# Patient Record
Sex: Male | Born: 1970 | Hispanic: No | Smoking: Never smoker
Health system: Southern US, Community
[De-identification: ages and names within clinical notes are randomized; demographics above are authoritative.]

## PROBLEM LIST (undated history)

## (undated) DIAGNOSIS — I10 Essential (primary) hypertension: Secondary | ICD-10-CM

## (undated) HISTORY — DX: Essential (primary) hypertension: I10

## (undated) HISTORY — PX: SHOULDER SURGERY: SHX246

---

## 2008-12-14 ENCOUNTER — Encounter: Admission: RE | Admit: 2008-12-14 | Discharge: 2008-12-14 | Payer: Self-pay | Admitting: Family Medicine

## 2008-12-14 ENCOUNTER — Ambulatory Visit: Payer: Self-pay | Admitting: Family Medicine

## 2008-12-14 DIAGNOSIS — M25519 Pain in unspecified shoulder: Secondary | ICD-10-CM

## 2008-12-20 ENCOUNTER — Encounter: Payer: Self-pay | Admitting: Family Medicine

## 2009-04-26 ENCOUNTER — Ambulatory Visit: Payer: Self-pay | Admitting: Family Medicine

## 2009-07-04 ENCOUNTER — Ambulatory Visit: Payer: Self-pay | Admitting: Family Medicine

## 2009-07-18 ENCOUNTER — Encounter: Payer: Self-pay | Admitting: Family Medicine

## 2010-03-11 NOTE — Assessment & Plan Note (Signed)
Summary: quad strain   Vital Signs:  Patient profile:   40 year old male Height:      73 inches Weight:      250 pounds Temp:     97.7 degrees F oral Pulse rate:   82 / minute BP sitting:   135 / 91  (left arm) Cuff size:   large  Vitals Entered By: Kathlene November (April 26, 2009 8:03 AM) CC: pulled left quad muscle 1 1/2 weeks ago while running   Primary Care Provider:  Seymour Bars DO  CC:  pulled left quad muscle 1 1/2 weeks ago while running.  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  He immediately  but he did not fall.  It has continued to hurt for about 10 days now.  He is taking some ibuprofen.  He has not run since then.  He admits to being a bit 'out of shape'.  He has not tried heat/ ice/ compression.    It hurts to walk down the stairs.  Denies pain in the hip or knee.  Has bruised over the medial quad but his pain seems to be more lateral.  Denies previous L leg injury.      Current Medications (verified): 1)  None  Allergies (verified): No Known Drug Allergies  Comments:  Nurse/Medical Assistant: The patient's medications and allergies were reviewed with the patient and were updated in the Medication and Allergy Lists. Kathlene November (April 26, 2009 8:04 AM)  Past History:  Past Medical History: Reviewed history from 12/14/2008 and no changes required. HTN -- not on meds  Social History: Reviewed history from 12/14/2008 and no changes required. 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 Denies smoking or ETOH. Works out 4 x a wk.  Review of Systems      See HPI  Physical Exam  General:  alert, well-developed, well-nourished, well-hydrated, and overweight-appearing.   Neck:  no masses.   Msk:  full bilat hip ROM  normal gait with pes planus tender over the medial and lateral L quad muscle, distal 1/3 with no palpable defect or muscle  buldge Extremities:  no LE edema Skin:  color normal.   small bruise over the medial distal L quad muscle Psych:  good eye contact, not anxious appearing, and not depressed appearing.     Impression & Recommendations:  Problem # 1:  SPRAIN&STRAIN OTHER SPECIFIED SITES KNEE&LEG (ICD-844.8) L quad strain during all out sprint 10 days ago with probable partial tear of the medail or lateral quad. Will try to obtain sports u/s to look for tear.  He may need to start formal PT to rehab.  Placed in ACE wrap today.  Advised on and off use of ice packs and Advil for the next 5 days with meals (400-600 mg).   Orders: Orthopedic Referral (Ortho)  Patient Instructions: 1)  ACE wrap L quad for compression. 2)  Use ice packs on and off during the day. 3)  Use Advil 2-3 tabs with meals (3 x a day) for pain and inflammation.  4)  F/U with ortho for sports ultrasound. 5)  Schedule a physical with fasting labs in May.

## 2010-03-11 NOTE — Letter (Signed)
Summary: Patient Cancellation/[facility]  Patient Cancellation/[facility]   Imported By: Lanelle Bal 08/01/2009 14:18:56  _____________________________________________________________________  External Attachment:    Type:   Image     Comment:   External Document

## 2010-03-11 NOTE — Assessment & Plan Note (Signed)
Summary: CPE   Vital Signs:  Patient profile:   40 year old male Height:      73 inches Weight:      240 pounds BMI:     31.78 O2 Sat:      98 % on Room air Pulse rate:   77 / minute BP sitting:   150 / 96  (left arm) Cuff size:   large  Vitals Entered By: Payton Spark CMA (Jul 04, 2009 9:31 AM)  O2 Flow:  Room air CC: CPE   Primary Care Provider:  Seymour Bars DO  CC:  CPE.  History of Present Illness: 40 yo Nicholas [name] presents for CPE.  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  He denies CP or DOE.  His Tdap is UTD but he is due for fasting labs.  Denies fam hx of premature heart dz, colon or prostate cancer.  He has strong fam hx of HTN.  He has lost 10 lbs by diet and exercise.  He has had 2 wks of Bright red rectal bleeding with stools.  Denies painful defecation, hemorrhoids or constipation.    Allergies: No Known Drug Allergies  Past History:  Past Medical History: Reviewed history from 12/14/2008 and no changes required. HTN -- not on meds  Family History: Reviewed history from 12/14/2008 and no changes required. sister HTN Father died of cirrhosis Mother alive, HTN, DM  Social History: Reviewed history from 12/14/2008 and no changes required. 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 Denies smoking or ETOH. Works out 4 x a wk.  Review of Systems  The patient denies anorexia, fever, weight loss, weight gain, vision loss, decreased hearing, hoarseness, chest pain, syncope, dyspnea on exertion, peripheral edema, prolonged cough, headaches, hemoptysis, abdominal pain, melena, hematochezia, severe indigestion/heartburn, hematuria, incontinence, genital sores, muscle weakness, suspicious skin lesions, transient blindness, difficulty walking, depression, unusual weight change, abnormal bleeding, enlarged lymph nodes, angioedema, breast masses, and testicular masses.    Physical Exam  General:  alert,  well-developed, well-nourished, well-hydrated, and overweight-appearing.   Head:  normocephalic and atraumatic.   Eyes:  pupils equal, pupils round, and pupils reactive to light.   Ears:  EACs patent; TMs translucent and gray with good cone of light and bony landmarks.  Nose:  no nasal discharge.   Mouth:  good dentition and pharynx pink and moist.   Neck:  no masses.   Lungs:  Normal respiratory effort, chest expands symmetrically. Lungs are clear to auscultation, no crackles or wheezes. Heart:  Normal rate and regular rhythm. S1 and S2 normal without gallop, murmur, click, rub or other extra sounds. Abdomen:  no AA bruits. soft, non-tender, normal bowel sounds, no distention, no masses, no guarding, no hepatomegaly, and no splenomegaly.   Rectal:  No external abnormalities noted. Normal sphincter tone. No rectal masses or tenderness. Msk:  tender talar tilt L ankle (sprained 4 wks ago) Pulses:  2+ radial and pedal pulses Extremities:  no E/C/C Neurologic:  gait normal.   Skin:  color normal.   Cervical Nodes:  No lymphadenopathy noted Psych:  good eye contact, not anxious appearing, and not depressed appearing.     Impression & Recommendations:  Problem # 1:  Preventive Health Care (ICD-V70.0) Keeping healthy checklist for men reviewed. BP high today, see #2. BMI 31 c/w class I obesity.  Will work on diet, exercise, wt loss. Update fasting labs. Tetanus UTD per hx.  DRE + PSA at 45.  Problem # 2:  ESSENTIAL HYPERTENSION, BENIGN (ICD-401.1) 2nd reading >140/90.  Will start on HCTZ daily.  Update fasting labs.  Work on [employer], exercise, wt loss. RTC in 2 mos for f/u.  Will need a BMP. His updated medication list for this problem includes:    Hydrochlorothiazide 25 Mg Tabs (Hydrochlorothiazide) .Marland Kitchen... 1 tab by mouth qam  BP today: 150/96 Prior BP: 135/91 (04/26/2009)  Problem # 3:  RECTAL BLEEDING (ICD-569.3) Normal DRE-- more concerning given abscence of hemorrhoids or  fissures as to the cause for 2 wks of bright red rectal bleeding.   Will refer to GI for eval.  Check CBC with fasting labs. Orders: Gastroenterology Referral (GI) T-CBC No Diff ([id number])  Complete Medication List: 1)  Hydrochlorothiazide 25 Mg Tabs (Hydrochlorothiazide) .Marland Kitchen.. 1 tab by mouth qam  Other Orders: T-Comprehensive Metabolic Panel ([id number]) T-Lipid Profile ([id number])  Patient Instructions: 1)  Start HCTZ daily for High BP. 2)  Work on low sodium diet, regular exercise. 3)  Referral made to GI for rectal bleeding. 4)  Update fasting labs. 5)  Will call you w/ results. 6)  Return for f/u High BP in 2 mos. Prescriptions: HYDROCHLOROTHIAZIDE 25 MG TABS (HYDROCHLOROTHIAZIDE) 1 tab by mouth qAM  #30 x 2   Entered and Authorized by:   Seymour Bars DO   Signed by:   Seymour Bars DO on 07/04/2009   Method used:   Electronically to        [facility] [phone]* (retail)       [address] [place], [place]  [address]       Ph: [phone] or [phone]       Fax: [phone]   RxID:   [id number]

## 2010-08-27 IMAGING — CR DG SHOULDER 2+V*L*
4 series · 4 of 4 positions shown · non-contrast
Comparison: None.

CLINICAL DATA: 38-year-old male with left shoulder pain for 2
months.  No known injury.

LEFT SHOULDER - 2+ VIEW

[view not recorded (1 of 4)]
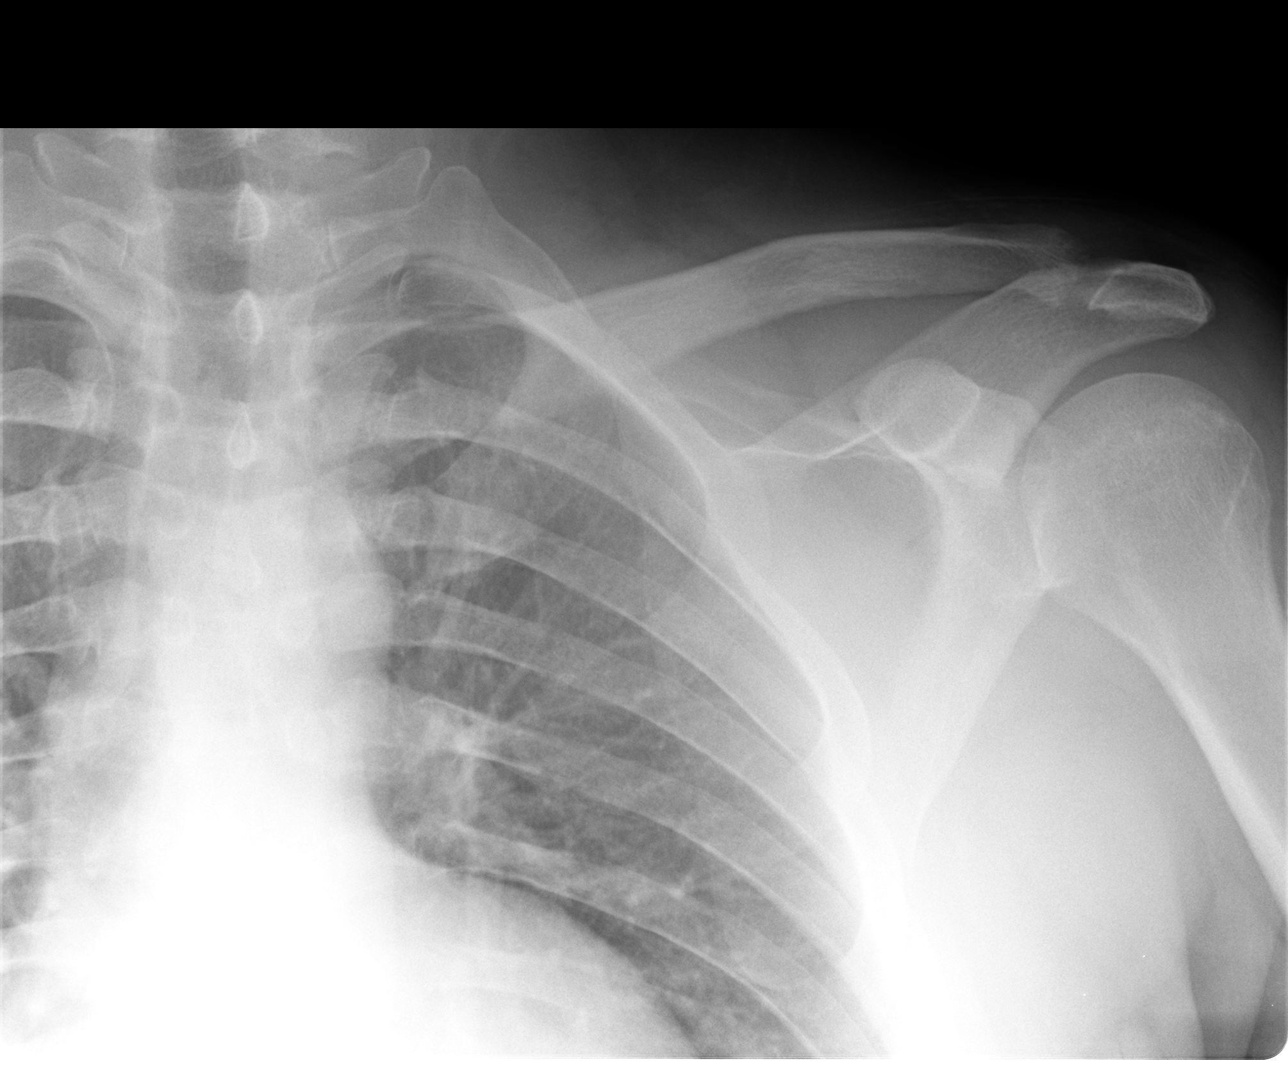

[view not recorded (2 of 4)]
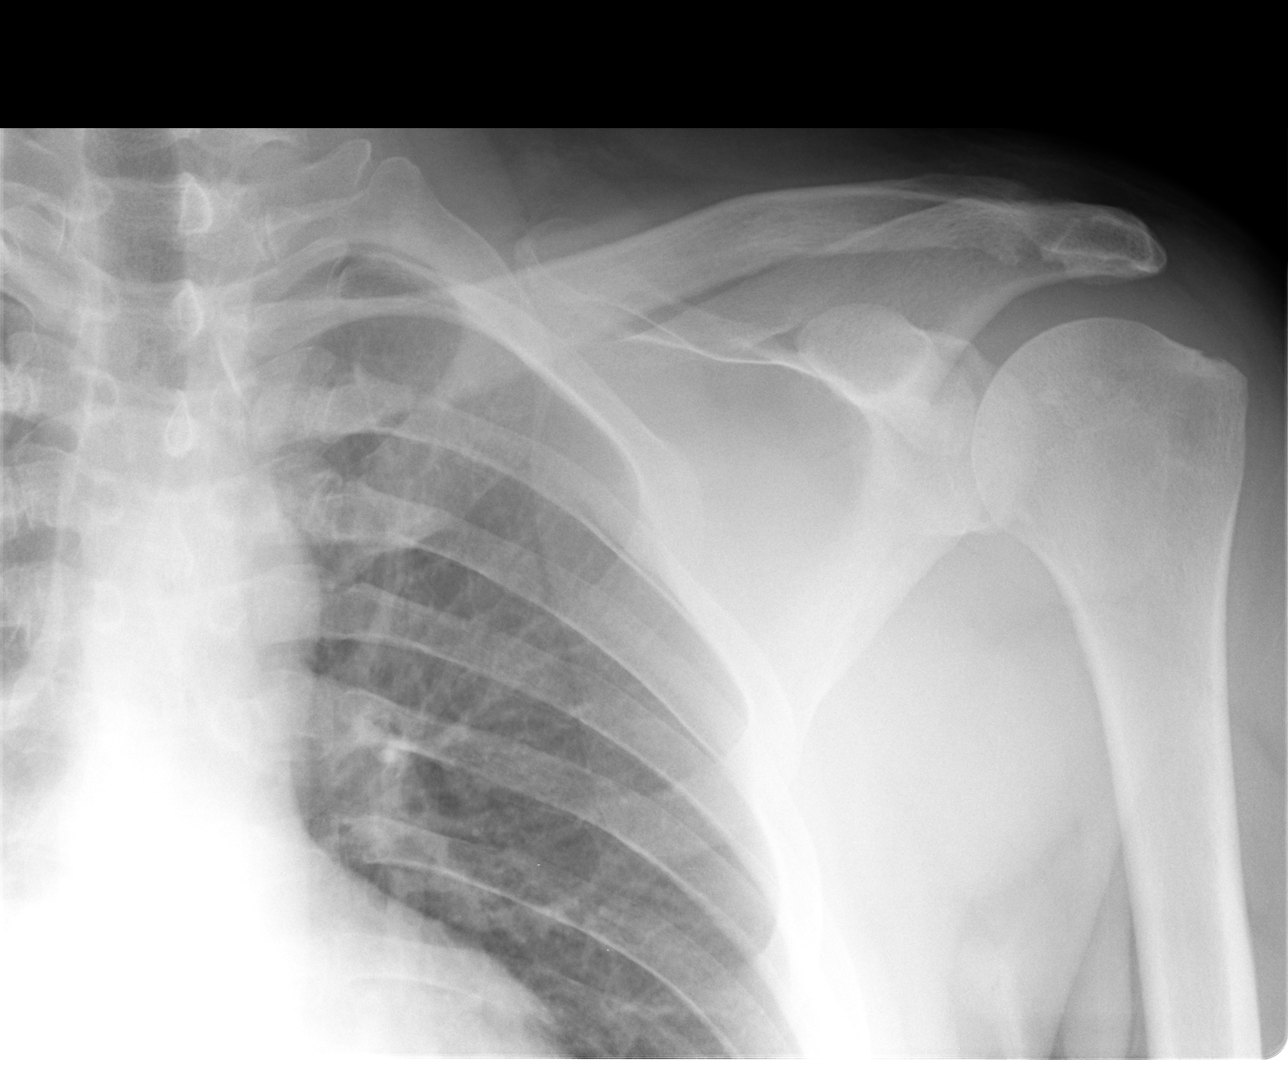

[view not recorded (3 of 4)]
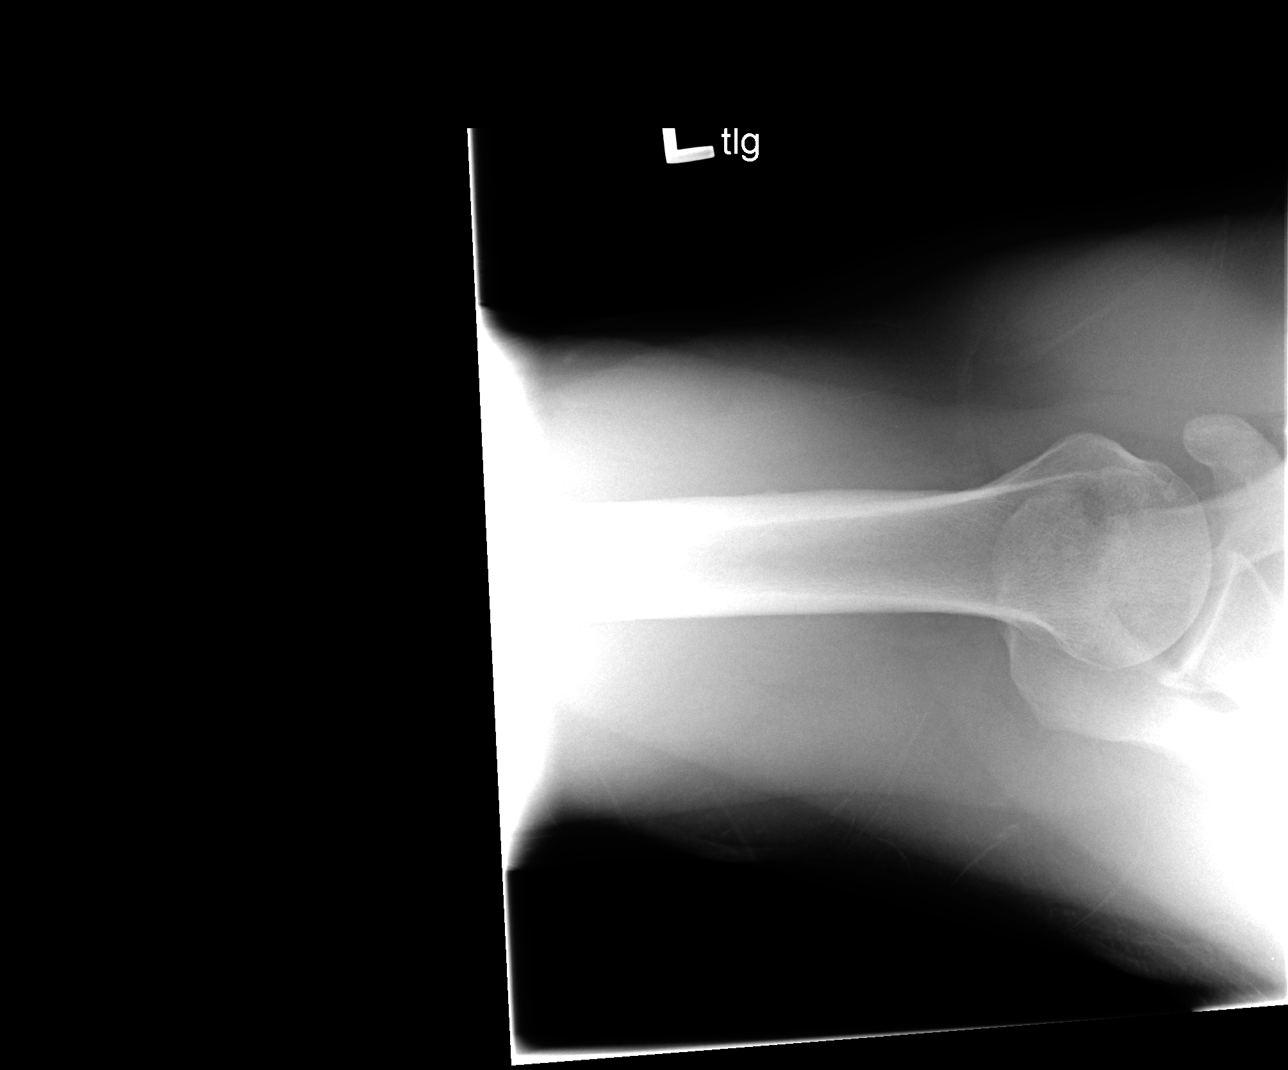

[view not recorded (4 of 4)]
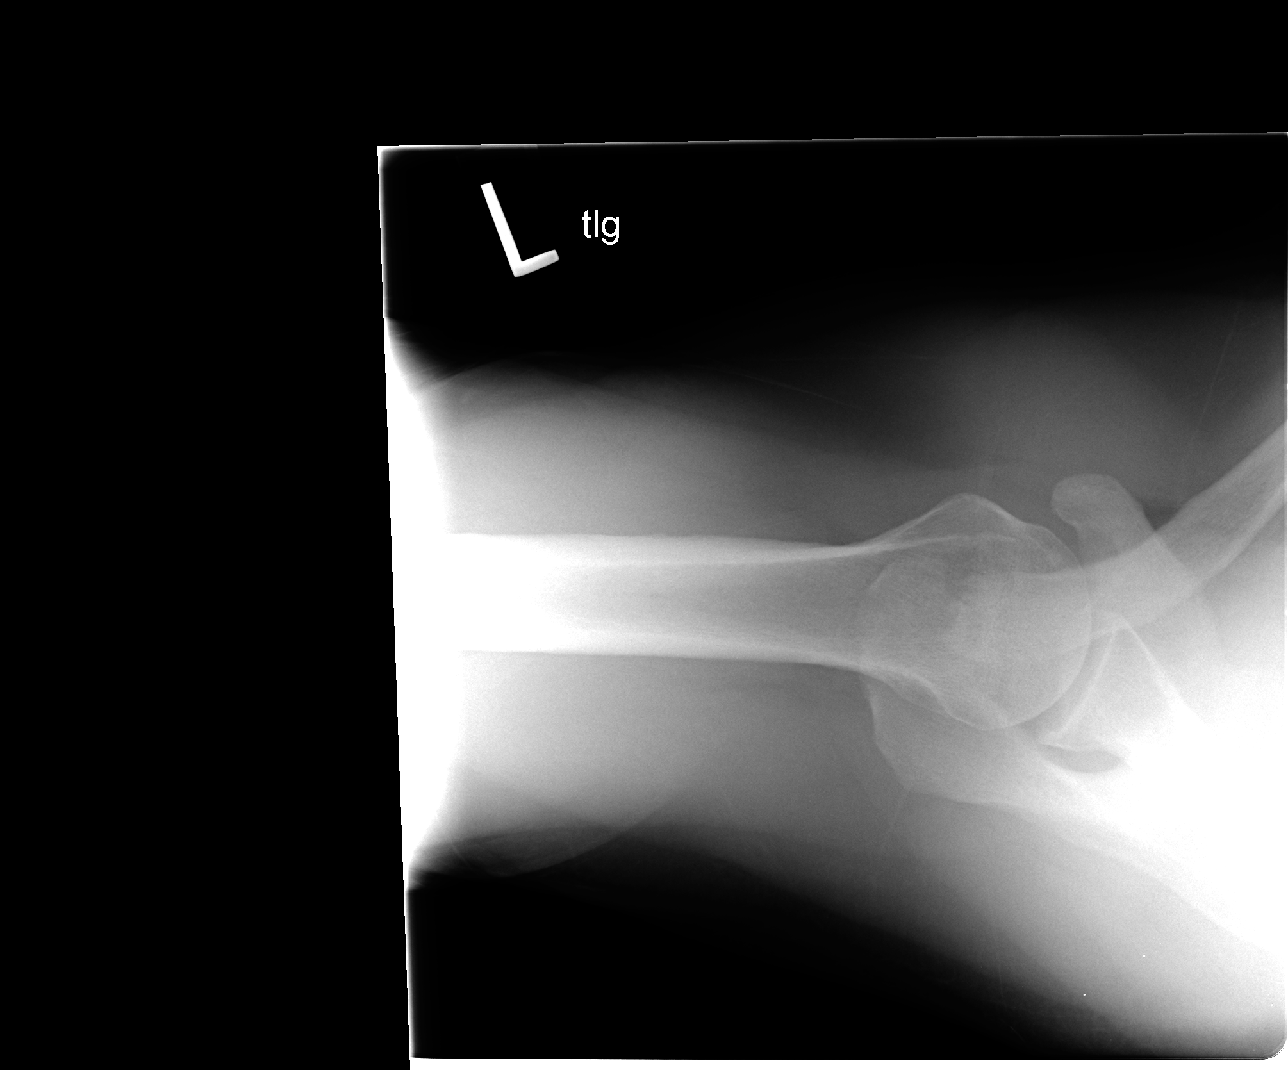

[4 of 4 positions shown; findings below may reference images not displayed]

FINDINGS: No glenohumeral joint dislocation.   Bone mineralization
is within normal limits. Left clavicle, visualized left humerus,
and left scapula appear intact.  Visualized left ribs and lung
parenchyma are within normal limits.
IMPRESSION: No acute osseous abnormality identified about the left shoulder.

## 2013-04-05 ENCOUNTER — Encounter: Payer: Self-pay | Admitting: Family Medicine

## 2013-04-05 ENCOUNTER — Ambulatory Visit (INDEPENDENT_AMBULATORY_CARE_PROVIDER_SITE_OTHER): Payer: 59 | Admitting: Family Medicine

## 2013-04-05 VITALS — BP 182/109 | HR 86 | Ht 73.0 in | Wt 278.0 lb

## 2013-04-05 DIAGNOSIS — I10 Essential (primary) hypertension: Secondary | ICD-10-CM

## 2013-04-05 DIAGNOSIS — R079 Chest pain, unspecified: Secondary | ICD-10-CM

## 2013-04-05 MED ORDER — LISINOPRIL-HYDROCHLOROTHIAZIDE 20-25 MG PO TABS: 1.0000 | ORAL_TABLET | Freq: Every day | ORAL | Status: DC

## 2013-04-05 NOTE — Progress Notes (Signed)
CC: Nicholas Le is a 43 y.o. male [facility]   Subjective: HPI:  Very pleasant Nicholas Le here to establish care  Patient complains of elevated blood pressure that was first noticed in 2011 he was prescribed hydrochlorothiazide however took only a few weeks of this due to forgetfulness but no known side effects or intolerance.  He checked his blood pressure last night due to chest discomfort and notes that it was elevated however he is unable to quantify how elevated today.  He does not check blood pressure outside of our office other than last night. Nothing that he knows of has made blood pressure better or worse other than above no interventions. He denies stimulant use, recreational drug use, decongestants, nor adding sodium to his diet. He tries to stay active and watch what he eats no formal exercise routine.  Complains of chest discomfort that i has been present since Saturday has been persistent nothing particularly makes better or worse described as mild and a slight sensation of tightness. Denies exertional component to his pain, he has never had this before there've been no interventions as of yet. Pain is localized in the left upper chest anteriorly nonradiating described only as pain. Denies diaphoresis, jaw pain, claudication nor motor or sensory disturbances  Review of Systems - General ROS: negative for - chills, fever, night sweats, weight gain or weight loss Ophthalmic ROS: negative for - decreased vision Psychological ROS: negative for - anxiety or depression ENT ROS: negative for - hearing change, nasal congestion, tinnitus or allergies Hematological and Lymphatic ROS: negative for - bleeding problems, bruising or swollen lymph nodes Breast ROS: negative Respiratory ROS: no cough, shortness of breath, or wheezing Cardiovascular ROS: no  dyspnea on exertion Gastrointestinal ROS: no abdominal pain, change in bowel habits, or  black or bloody stools Genito-Urinary ROS: negative for - genital discharge, genital ulcers, incontinence or abnormal bleeding from genitals Musculoskeletal ROS: negative for - joint pain or muscle pain Neurological ROS: negative for - headaches or memory loss Dermatological ROS: negative for lumps, mole changes, rash and skin lesion changes  Past Medical History  Diagnosis Date  . Hypertension     Past Surgical History  Procedure Laterality Date  . Shoulder surgery     Family History  Problem Relation Age of Onset  . Diabetes Mother   . Stroke Mother   . Hypertension Mother     History   Social History  . Marital Status: Married    Spouse Name: N/A    Number of Children: N/A  . Years of Education: N/A   Occupational History  . Not on file.   Social History Main Topics  . Smoking status: Never Smoker   . Smokeless tobacco: Not on file  . Alcohol Use: No  . Drug Use: No  . Sexual Activity: Yes    Partners: Female   Other Topics Concern  . Not on file   Social History Narrative  . No narrative on file     Objective: BP 182/109  Pulse 86  Ht 6\' 1"  (1.854 m)  Wt 278 lb (126.1 kg)  BMI 36.69 kg/m2  General: Alert and Oriented, No Acute Distress HEENT: Pupils equal, round, reactive to light. Conjunctivae clear.  Moist mucous membranes pharynx unremarkable Lungs: Clear to auscultation bilaterally, no wheezing/ronchi/rales.  Comfortable work of breathing. Good air movement. Cardiac: Regular rate and rhythm. Normal S1/S2.  No murmurs, rubs, nor gallops.   Abdomen: Soft  nontender Extremities: No peripheral edema.  Strong peripheral pulses.  Mental Status: No depression, anxiety, nor agitation. Skin: Warm and dry.  Assessment & Plan: [name] was seen today for [facility] care and chest pain.  Diagnoses and associated orders for this visit:  Essential hypertension, benign - lisinopril-hydrochlorothiazide (PRINZIDE,ZESTORETIC) 20-25 MG per tablet; Take 1 tablet  by mouth daily. - CBC - TSH - COMPLETE METABOLIC PANEL WITH GFR  Chest pain - CBC - TSH - COMPLETE METABOLIC PANEL WITH GFR - PR ELECTROCARDIOGRAM, COMPLETE    Essential hypertension: Uncontrolled chronic condition discussed diet and exercise interventions to help lower blood pressure however start with lisinopril-hydrochlorothiazide returning in 2 weeks for blood pressure recheck. Rule out hyperthyroidism and renal insufficiency Chest pain: EKG was obtained showing normal sinus rhythm, normal axis, normal intervals, no pathologic Q waves, normal QRS morphology, no ST segment elevation or depression. Low suspicion of ACS will focus on lowering blood pressure as primary intervention. Rule out metabolic contribution to chest pain   Return in about 2 weeks (around 04/19/2013).

## 2013-04-06 LAB — COMPLETE METABOLIC PANEL WITH GFR
ALT: 34 U/L (ref 0–53)
AST: 22 U/L (ref 0–37)
Albumin: 4.5 g/dL (ref 3.5–5.2)
Alkaline Phosphatase: 67 U/L (ref 39–117)
BILIRUBIN TOTAL: 0.6 mg/dL (ref 0.2–1.2)
BUN: 12 mg/dL (ref 6–23)
CALCIUM: 9.6 mg/dL (ref 8.4–10.5)
CHLORIDE: 100 meq/L (ref 96–112)
CO2: 27 meq/L (ref 19–32)
Creat: 0.95 mg/dL (ref 0.50–1.35)
GLUCOSE: 76 mg/dL (ref 70–99)
Potassium: 3.9 mEq/L (ref 3.5–5.3)
SODIUM: 139 meq/L (ref 135–145)
TOTAL PROTEIN: 7.8 g/dL (ref 6.0–8.3)

## 2013-04-06 LAB — CBC
HEMATOCRIT: 46.3 % (ref 39.0–52.0)
HEMOGLOBIN: 15.8 g/dL (ref 13.0–17.0)
MCH: 25.6 pg — AB (ref 26.0–34.0)
MCHC: 34.1 g/dL (ref 30.0–36.0)
MCV: 74.9 fL — ABNORMAL LOW (ref 78.0–100.0)
Platelets: 259 10*3/uL (ref 150–400)
RBC: 6.18 MIL/uL — ABNORMAL HIGH (ref 4.22–5.81)
RDW: 15.2 % (ref 11.5–15.5)
WBC: 8.4 10*3/uL (ref 4.0–10.5)

## 2013-04-06 LAB — TSH: TSH: 1.144 u[IU]/mL (ref 0.350–4.500)

## 2013-04-07 ENCOUNTER — Encounter: Payer: Self-pay | Admitting: *Deleted

## 2014-01-01 ENCOUNTER — Ambulatory Visit: Payer: 59 | Admitting: Family Medicine

## 2014-06-19 ENCOUNTER — Other Ambulatory Visit: Payer: Self-pay

## 2014-06-19 DIAGNOSIS — I10 Essential (primary) hypertension: Secondary | ICD-10-CM

## 2014-06-19 MED ORDER — LISINOPRIL-HYDROCHLOROTHIAZIDE 20-25 MG PO TABS: 1.0000 | ORAL_TABLET | Freq: Every day | ORAL | Status: DC

## 2014-06-20 ENCOUNTER — Encounter: Payer: Self-pay | Admitting: Family Medicine

## 2014-06-20 ENCOUNTER — Ambulatory Visit (INDEPENDENT_AMBULATORY_CARE_PROVIDER_SITE_OTHER): Payer: 59 | Admitting: Family Medicine

## 2014-06-20 VITALS — BP 144/94 | HR 105 | Wt 282.0 lb

## 2014-06-20 DIAGNOSIS — R079 Chest pain, unspecified: Secondary | ICD-10-CM

## 2014-06-20 DIAGNOSIS — I10 Essential (primary) hypertension: Secondary | ICD-10-CM | POA: Diagnosis not present

## 2014-06-20 MED ORDER — LISINOPRIL-HYDROCHLOROTHIAZIDE 20-25 MG PO TABS: 2.0000 | ORAL_TABLET | Freq: Every day | ORAL | Status: DC

## 2014-06-20 NOTE — Progress Notes (Signed)
CC: Nicholas Le is a 44 y.o. male is here for Hypertension   Subjective: HPI:  Follow-up essential hypertension: Continues to take lisinopril-hydrochlorothiazide, he is taken for 5 days straight now but admits he forgets to take it most days of the week. He denies any known side effects other than increased urination but that does not interfere with quality of life. No outside blood pressures report. Denies any known side effects. Denies shortness of breath, cough, angioedema, facial swelling, peripheral edema, nor motor or sensory disturbances  Complains of continued chest pain that has come and gone since I saw him last. He's had periods of days to weeks where the pain is completely absent. He denies any exertional component to the pain. He cannot influence the pain with any activity position or movement. He denies any overlying skin changes. No pain with breathing or cough. No family history of heart disease.   Review Of Systems Outlined In HPI  Past Medical History  Diagnosis Date  . Hypertension     Past Surgical History  Procedure Laterality Date  . Shoulder surgery     Family History  Problem Relation Age of Onset  . Diabetes Mother   . Stroke Mother   . Hypertension Mother     History   Social History  . Marital Status: Married    Spouse Name: N/A  . Number of Children: N/A  . Years of Education: N/A   Occupational History  . Not on file.   Social History Main Topics  . Smoking status: Never Smoker   . Smokeless tobacco: Not on file  . Alcohol Use: No  . Drug Use: No  . Sexual Activity:    Partners: Female   Other Topics Concern  . Not on file   Social History Narrative     Objective: BP 144/94 mmHg  Pulse 105  Wt 282 lb (127.914 kg)  General: Alert and Oriented, No Acute Distress HEENT: Pupils equal, round, reactive to light. Conjunctivae clear. Moist mucous membranes Lungs: Clear to auscultation bilaterally, no wheezing/ronchi/rales.   Comfortable work of breathing. Good air movement. Cardiac: Regular rate and rhythm. Normal S1/S2.  No murmurs, rubs, nor gallops.   Chest: He localizes his pain about 4 finger widths above the nipple pressure here applied by my hand does not reproduce any pain. No palpable abnormality Extremities: No peripheral edema.  Strong peripheral pulses.  Mental Status: No depression, anxiety, nor agitation. Skin: Warm and dry.  Assessment & Plan: Nicholas Le was seen today for hypertension.  Diagnoses and all orders for this visit:  Essential hypertension, benign Orders: -     lisinopril-hydrochlorothiazide (PRINZIDE,ZESTORETIC) 20-25 MG per tablet; Take 2 tablets by mouth daily.  Chest pain, unspecified chest pain type   Essential hypertension: Uncontrolled chronic condition increasingLisinopril and hydrochlorothiazide. Chest pain: Relative rest with the upper extremity suspicion for costochondritis however if no improvement after the weekend of resting call me and I will refer to cardiology for consideration of stress test   Return in about 4 weeks (around 07/18/2014).

## 2014-06-25 ENCOUNTER — Ambulatory Visit: Payer: Self-pay | Admitting: Family Medicine

## 2014-07-18 ENCOUNTER — Encounter: Payer: Self-pay | Admitting: Family Medicine

## 2014-07-18 ENCOUNTER — Ambulatory Visit (INDEPENDENT_AMBULATORY_CARE_PROVIDER_SITE_OTHER): Payer: 59 | Admitting: Family Medicine

## 2014-07-18 VITALS — BP 131/89 | HR 89 | Wt 284.0 lb

## 2014-07-18 DIAGNOSIS — I10 Essential (primary) hypertension: Secondary | ICD-10-CM

## 2014-07-18 LAB — BASIC METABOLIC PANEL WITH GFR
BUN: 19 mg/dL (ref 6–23)
CALCIUM: 9.9 mg/dL (ref 8.4–10.5)
CHLORIDE: 99 meq/L (ref 96–112)
CO2: 30 meq/L (ref 19–32)
CREATININE: 1.03 mg/dL (ref 0.50–1.35)
GFR, EST NON AFRICAN AMERICAN: 87 mL/min
GFR, Est African American: >89 mL/min
GLUCOSE: 105 mg/dL — AB (ref 70–99)
POTASSIUM: 4.2 meq/L (ref 3.5–5.3)
Sodium: 139 mEq/L (ref 135–145)

## 2014-07-18 NOTE — Progress Notes (Signed)
CC: Nicholas Le is a 44 y.o. male is here for Hypertension   Subjective: HPI:   follow-up essential hypertension:  Continue to take 2 tablets of lisinopril-hydrochlorothiazide 20-25 on a daily basis with no known side effects. No outside blood pressures to report. Denies chest pain shortness of breath orthopnea nor peripheral edema. Denies any motor or sensory disturbances.   Review Of Systems Outlined In HPI  Past Medical History  Diagnosis Date  . Hypertension     Past Surgical History  Procedure Laterality Date  . Shoulder surgery     Family History  Problem Relation Age of Onset  . Diabetes Mother   . Stroke Mother   . Hypertension Mother     History   Social History  . Marital Status: Married    Spouse Name: N/A  . Number of Children: N/A  . Years of Education: N/A   Occupational History  . Not on file.   Social History Main Topics  . Smoking status: Never Smoker   . Smokeless tobacco: Not on file  . Alcohol Use: No  . Drug Use: No  . Sexual Activity:    Partners: Female   Other Topics Concern  . Not on file   Social History Narrative     Objective: BP 131/89 mmHg  Pulse 89  Wt 284 lb (128.822 kg)  General: Alert and Oriented, No Acute Distress HEENT: Pupils equal, round, reactive to light. Conjunctivae clear.  Moist mucous membranes Lungs: Clear to auscultation bilaterally, no wheezing/ronchi/rales.  Comfortable work of breathing. Good air movement. Cardiac: Regular rate and rhythm. Normal S1/S2.  No murmurs, rubs, nor gallops.   Extremities: No peripheral edema.  Strong peripheral pulses.  Mental Status: No depression, anxiety, nor agitation. Skin: Warm and dry.  Assessment & Plan: Nicholas Le was seen today for hypertension.  Diagnoses and all orders for this visit:  Essential hypertension Orders: -     BASIC METABOLIC PANEL WITH GFR   Essential hypertension: Controlled continue current double dose of lisinopril-hydrochlorothiazide.  Checking renal function on this new medication regimen to ensure no electrolyte abnormality or renal insufficiency.  Return in about 3 months (around 10/18/2014).

## 2014-10-08 ENCOUNTER — Other Ambulatory Visit: Payer: Self-pay | Admitting: Family Medicine

## 2014-12-31 ENCOUNTER — Telehealth: Payer: Self-pay

## 2014-12-31 MED ORDER — SCOPOLAMINE 1 MG/3DAYS TD PT72: 1.0000 | MEDICATED_PATCH | TRANSDERMAL | Status: DC

## 2014-12-31 NOTE — Telephone Encounter (Signed)
Patient is going on a cruise and would like a patch for motion sickness. Please advise.

## 2014-12-31 NOTE — Telephone Encounter (Signed)
[facility] [address]

## 2014-12-31 NOTE — Telephone Encounter (Signed)
Pt.notified

## 2015-02-25 ENCOUNTER — Ambulatory Visit (INDEPENDENT_AMBULATORY_CARE_PROVIDER_SITE_OTHER): Payer: 59 | Admitting: Family Medicine

## 2015-02-25 ENCOUNTER — Encounter: Payer: Self-pay | Admitting: Family Medicine

## 2015-02-25 VITALS — BP 136/97 | HR 81 | Wt 287.0 lb

## 2015-02-25 DIAGNOSIS — I10 Essential (primary) hypertension: Secondary | ICD-10-CM | POA: Diagnosis not present

## 2015-02-25 MED ORDER — LISINOPRIL-HYDROCHLOROTHIAZIDE 20-25 MG PO TABS
2.0000 | ORAL_TABLET | Freq: Every day | ORAL | Status: DC
Start: 2015-02-25 — End: 2015-09-07

## 2015-02-25 NOTE — Progress Notes (Signed)
CC: Nicholas Le is a 45 y.o. male is here for Hypertension   Subjective: HPI:  Follow-up essential hypertension: Ran out of lisinopril-hydrochlorothiazide 2 days ago. No outside blood pressures to report. Denies chest pain shortness of breath orthopnea nor peripheral edema. Denies any known side effects.   Review Of Systems Outlined In HPI  Past Medical History  Diagnosis Date  . Hypertension     Past Surgical History  Procedure Laterality Date  . Shoulder surgery     Family History  Problem Relation Age of Onset  . Diabetes Mother   . Stroke Mother   . Hypertension Mother     Social History   Social History  . Marital Status: Married    Spouse Name: N/A  . Number of Children: N/A  . Years of Education: N/A   Occupational History  . Not on file.   Social History Main Topics  . Smoking status: Never Smoker   . Smokeless tobacco: Not on file  . Alcohol Use: No  . Drug Use: No  . Sexual Activity:    Partners: Female   Other Topics Concern  . Not on file   Social History Narrative     Objective: BP 136/97 mmHg  Pulse 81  Wt 287 lb (130.182 kg)  General: Alert and Oriented, No Acute Distress HEENT: Pupils equal, round, reactive to light. Conjunctivae clear.  Moist mucous membranes Lungs: Clear to auscultation bilaterally, no wheezing/ronchi/rales.  Comfortable work of breathing. Good air movement. Cardiac: Regular rate and rhythm. Normal S1/S2.  No murmurs, rubs, nor gallops.   Extremities: No peripheral edema.  Strong peripheral pulses.  Mental Status: No depression, anxiety, nor agitation. Skin: Warm and dry.  Assessment & Plan: [name] was seen today for hypertension.  Diagnoses and all orders for this visit:  Essential hypertension, benign -     lisinopril-hydrochlorothiazide (PRINZIDE,ZESTORETIC) 20-25 MG tablet; Take 2 tablets by mouth daily.   Essential hypertension: Uncontrolled off of lisinopril-hydrochlorothiazide, start on former  regimen. Blood pressure remains above 140/90 follow-up within the next 1 or 2 weeks.  He declines flu shot  Return in about 6 months (around 08/25/2015).

## 2015-09-07 ENCOUNTER — Other Ambulatory Visit: Payer: Self-pay | Admitting: Family Medicine

## 2015-09-07 DIAGNOSIS — I10 Essential (primary) hypertension: Secondary | ICD-10-CM

## 2015-10-06 ENCOUNTER — Other Ambulatory Visit: Payer: Self-pay | Admitting: Family Medicine

## 2015-10-06 DIAGNOSIS — I10 Essential (primary) hypertension: Secondary | ICD-10-CM

## 2015-12-04 ENCOUNTER — Encounter: Payer: Self-pay | Admitting: Osteopathic Medicine

## 2015-12-04 ENCOUNTER — Ambulatory Visit (INDEPENDENT_AMBULATORY_CARE_PROVIDER_SITE_OTHER): Payer: 59 | Admitting: Osteopathic Medicine

## 2015-12-04 VITALS — BP 148/87 | HR 67 | Ht 73.0 in | Wt 249.0 lb

## 2015-12-04 DIAGNOSIS — R7301 Impaired fasting glucose: Secondary | ICD-10-CM

## 2015-12-04 DIAGNOSIS — I10 Essential (primary) hypertension: Secondary | ICD-10-CM | POA: Diagnosis not present

## 2015-12-04 DIAGNOSIS — Z13220 Encounter for screening for lipoid disorders: Secondary | ICD-10-CM

## 2015-12-04 DIAGNOSIS — Z131 Encounter for screening for diabetes mellitus: Secondary | ICD-10-CM | POA: Diagnosis not present

## 2015-12-04 LAB — COMPLETE METABOLIC PANEL WITH GFR
ALBUMIN: 4.1 g/dL (ref 3.6–5.1)
ALK PHOS: 71 U/L (ref 40–115)
ALT: 25 U/L (ref 9–46)
AST: 20 U/L (ref 10–40)
BILIRUBIN TOTAL: 0.5 mg/dL (ref 0.2–1.2)
BUN: 17 mg/dL (ref 7–25)
CO2: 29 mmol/L (ref 20–31)
Calcium: 9.8 mg/dL (ref 8.6–10.3)
Chloride: 102 mmol/L (ref 98–110)
Creat: 1.16 mg/dL (ref 0.60–1.35)
GFR, Est African American: 86 mL/min (ref >=60)
GFR, Est Non African American: 75 mL/min (ref >=60)
GLUCOSE: 83 mg/dL (ref 65–99)
Potassium: 4.8 mmol/L (ref 3.5–5.3)
SODIUM: 138 mmol/L (ref 135–146)
TOTAL PROTEIN: 7.3 g/dL (ref 6.1–8.1)

## 2015-12-04 LAB — LIPID PANEL
Cholesterol: 164 mg/dL (ref 125–200)
HDL: 49 mg/dL (ref >=40)
LDL CALC: 97 mg/dL (ref <130)
Total CHOL/HDL Ratio: 3.3 Ratio (ref <=5.0)
Triglycerides: 91 mg/dL (ref <150)
VLDL: 18 mg/dL (ref <30)

## 2015-12-04 LAB — CBC WITH DIFFERENTIAL/PLATELET
BASOS ABS: 48 {cells}/uL (ref 0–200)
BASOS PCT: 1 %
EOS PCT: 2 %
Eosinophils Absolute: 96 cells/uL (ref 15–500)
HCT: 43.4 % (ref 38.5–50.0)
HEMOGLOBIN: 14.4 g/dL (ref 13.2–17.1)
LYMPHS ABS: 1248 {cells}/uL (ref 850–3900)
Lymphocytes Relative: 26 %
MCH: 25.6 pg — ABNORMAL LOW (ref 27.0–33.0)
MCHC: 33.2 g/dL (ref 32.0–36.0)
MCV: 77.2 fL — ABNORMAL LOW (ref 80.0–100.0)
MPV: 9.7 fL (ref 7.5–12.5)
Monocytes Absolute: 480 cells/uL (ref 200–950)
Monocytes Relative: 10 %
NEUTROS ABS: 2928 {cells}/uL (ref 1500–7800)
Neutrophils Relative %: 61 %
PLATELETS: 259 10*3/uL (ref 140–400)
RBC: 5.62 MIL/uL (ref 4.20–5.80)
RDW: 14.7 % (ref 11.0–15.0)
WBC: 4.8 10*3/uL (ref 3.8–10.8)

## 2015-12-04 MED ORDER — LISINOPRIL-HYDROCHLOROTHIAZIDE 20-25 MG PO TABS: 2.0000 | ORAL_TABLET | Freq: Every day | ORAL | 3 refills | Status: DC

## 2015-12-04 MED ORDER — AMLODIPINE BESYLATE 5 MG PO TABS: 5.0000 mg | ORAL_TABLET | Freq: Every day | ORAL | 1 refills | Status: DC

## 2015-12-04 NOTE — Progress Notes (Signed)
HPI: Nicholas Le is a 45 y.o. male  who presents to [name] today, 12/04/15,  for chief complaint of:  Chief Complaint  Patient presents with  . Establish Care    Switching from [place]/ blood pressure    Hypertension: Patient has been taking medications as noted below. He is not out of medicines but he is running low. Not measuring home blood pressures. No chest pain, pressure, headache, vision change, shortness of breath.  No lab work in some time other than BMP greater than 1 year ago  Past medical, surgical, social and family history reviewed: Past Medical History:  Diagnosis Date  . Hypertension    Past Surgical History:  Procedure Laterality Date  . SHOULDER SURGERY     Social History  Substance Use Topics  . Smoking status: Never Smoker  . Smokeless tobacco: Never Used  . Alcohol use No   Family History  Problem Relation Age of Onset  . Diabetes Mother   . Stroke Mother   . Hypertension Mother      Current medication list and allergy/intolerance information reviewed:   Current Outpatient Prescriptions on File Prior to Visit  Medication Sig Dispense Refill  . lisinopril-hydrochlorothiazide (PRINZIDE,ZESTORETIC) 20-25 MG tablet Take 2 tablets by mouth daily. NEED FOLLOW UP APPOINTMENT FOR MORE REFILLS 30 tablet 0   No current facility-administered medications on file prior to visit.    No Known Allergies    Review of Systems:  Constitutional: No recent illness  HEENT: No  headache, no vision change  Cardiac: No  chest pain, No  pressure, No palpitations  Respiratory:  No  shortness of breath.   Neurologic: No  weakness, No  Dizziness  Exam:  BP (!) 148/87   Pulse 67   Ht 6\' 1"  (1.854 m)   Wt 249 lb (112.9 kg)   BMI 32.85 kg/m  blood pressure confirmed on manual measurement  Constitutional: VS see above. General Appearance: alert, well-developed, well-nourished, NAD  Eyes: Normal lids and conjunctive,  non-icteric sclera  Ears, Nose, Mouth, Throat: MMM, Normal external inspection ears/nares/mouth/lips/gums.  Neck: No masses, trachea midline.   Respiratory: Normal respiratory effort. no wheeze, no rhonchi, no rales  Cardiovascular: S1/S2 normal, no murmur, no rub/gallop auscultated. RRR.   Musculoskeletal: Gait normal. Symmetric and independent movement of all extremities  Neurological: Normal balance/coordination. No tremor.  Skin: warm, dry, intact.   Psychiatric: Normal judgment/insight. Normal mood and affect. Oriented x3.   Previous labs reviewed, BMP January 2016, no major concerns. Elevated glucose on previous labs, patient can't recall whether he was fasting or not. At any rate needs diabetes screening for blood pressure.   ASSESSMENT/PLAN:   Essential hypertension, benign - Add amlodipine to current regimen - Plan: CBC with Differential/Platelet, COMPLETE METABOLIC PANEL WITH GFR, Lipid panel, amLODipine (NORVASC) 5 MG tablet, Hemoglobin A1c, lisinopril-hydrochlorothiazide (PRINZIDE,ZESTORETIC) 20-25 MG tablet  Abnormal fasting glucose - Plan: Hemoglobin A1c  Lipid screening - Had some coffee with cream and sweetener and earlier today  Diabetes mellitus screening     Visit summary with medication list and pertinent instructions was printed for patient to review. All questions at time of visit were answered - patient instructed to contact office with any additional concerns. ER/RTC precautions were reviewed with the patient. Follow-up plan: Return in about 4 weeks (around 01/01/2016) for ANNUAL PHYSICAL WITH BP RECHECK.

## 2015-12-05 ENCOUNTER — Encounter: Payer: Self-pay | Admitting: Osteopathic Medicine

## 2015-12-05 DIAGNOSIS — R718 Other abnormality of red blood cells: Secondary | ICD-10-CM | POA: Insufficient documentation

## 2015-12-05 LAB — HEMOGLOBIN A1C
HEMOGLOBIN A1C: 5.4 % (ref <5.7)
MEAN PLASMA GLUCOSE: 108 mg/dL

## 2016-01-07 ENCOUNTER — Encounter: Payer: 59 | Admitting: Osteopathic Medicine

## 2016-01-20 ENCOUNTER — Encounter: Payer: 59 | Admitting: Osteopathic Medicine

## 2016-02-03 ENCOUNTER — Other Ambulatory Visit: Payer: Self-pay | Admitting: Osteopathic Medicine

## 2016-02-03 DIAGNOSIS — I10 Essential (primary) hypertension: Secondary | ICD-10-CM

## 2016-03-03 ENCOUNTER — Encounter: Payer: Self-pay | Admitting: Osteopathic Medicine

## 2016-03-03 ENCOUNTER — Ambulatory Visit (INDEPENDENT_AMBULATORY_CARE_PROVIDER_SITE_OTHER): Payer: 59 | Admitting: Osteopathic Medicine

## 2016-03-03 DIAGNOSIS — I10 Essential (primary) hypertension: Secondary | ICD-10-CM | POA: Diagnosis not present

## 2016-03-03 MED ORDER — AMLODIPINE BESYLATE 5 MG PO TABS
5.0000 mg | ORAL_TABLET | Freq: Every day | ORAL | 3 refills | Status: DC
Start: 2016-03-03 — End: 2017-03-09

## 2016-03-03 NOTE — Progress Notes (Signed)
HPI: Nicholas Le is a 46 y.o. male  who [name] today, 03/03/16,  for chief complaint of:  Chief Complaint  Patient presents with  . Follow-up    MEDICATION REFILL    Essential hypertension: Patient here for refill on medications and recheck blood pressure. Blood pressure improved with addition of amlodipine. No home blood pressures report. No chest pain, pressure, signs of breath.  Past medical, surgical, social and family history reviewed: Patient Active Problem List   Diagnosis Date Noted  . 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 Chest pain 06/20/2014  . Essential hypertension, benign 04/05/2013  . SHOULDER PAIN, LEFT 12/14/2008   Past Surgical History:  Procedure Laterality Date  . SHOULDER SURGERY     Social History  Substance Use Topics  . Smoking status: Never Smoker  . Smokeless tobacco: Never Used  . Alcohol use No   Family History  Problem Relation Age of Onset  . Diabetes Mother   . Stroke Mother   . Hypertension Mother      Current medication list and allergy/intolerance information reviewed:   Current Outpatient Prescriptions on File Prior to Visit  Medication Sig Dispense Refill  . lisinopril-hydrochlorothiazide (PRINZIDE,ZESTORETIC) 20-25 MG tablet Take 2 tablets by mouth daily. 60 tablet 3   No current facility-administered medications on file prior to visit.    No Known Allergies    Review of Systems:  Constitutional: No recent illness  HEENT: No  headache, no vision change  Cardiac: No  chest pain, No  pressure, No palpitations  Respiratory:  No  shortness of breath. No  Cough  Neurologic: No  weakness, No  Dizziness   Exam:  BP 134/78   Pulse 81   Ht 6\' 1"  (1.854 m)   Wt 248 lb (112.5 kg)   BMI 32.72 kg/m   Constitutional: VS see above. General Appearance: alert, well-developed, well-nourished, NAD  Eyes: Normal lids and conjunctive, non-icteric sclera  Ears, Nose, Mouth, Throat:  MMM, Normal external inspection ears/nares/mouth/lips/gums.  Neck: No masses, trachea midline.   Respiratory: Normal respiratory effort. no wheeze, no rhonchi, no rales  Cardiovascular: S1/S2 normal, no murmur, no rub/gallop auscultated. RRR.   Musculoskeletal: Gait normal. Symmetric and independent movement of all extremities  Neurological: Normal balance/coordination. No tremor.  Skin: warm, dry, intact.   Psychiatric: Normal judgment/insight. Normal mood and affect. Oriented x3.     ASSESSMENT/PLAN:   Essential hypertension, benign - improved with addition of amlodipine, continue this plus current Lisinopril-HCT - Plan: amLODipine (NORVASC) 5 MG tablet     Visit summary with medication list and pertinent instructions was printed for patient to review. All questions at time of visit were answered - patient instructed to contact office with any additional concerns. ER/RTC precautions were reviewed with the patient. Follow-up plan: Return in about 1 year (around 03/03/2017) for ANNUAL PHYSICAL W/ LABS, nurse visit 6 momths for BP check .

## 2016-04-13 ENCOUNTER — Other Ambulatory Visit: Payer: Self-pay | Admitting: Osteopathic Medicine

## 2016-04-13 DIAGNOSIS — I10 Essential (primary) hypertension: Secondary | ICD-10-CM

## 2016-05-06 ENCOUNTER — Other Ambulatory Visit: Payer: Self-pay | Admitting: Osteopathic Medicine

## 2016-05-06 ENCOUNTER — Telehealth: Payer: Self-pay

## 2016-05-06 DIAGNOSIS — I10 Essential (primary) hypertension: Secondary | ICD-10-CM

## 2016-05-06 NOTE — Telephone Encounter (Signed)
error 

## 2016-05-07 ENCOUNTER — Telehealth: Payer: Self-pay

## 2016-05-07 NOTE — Telephone Encounter (Signed)
Thanks

## 2016-05-07 NOTE — Telephone Encounter (Signed)
Pt's wife called and said the pharmacy did not have script for lisinoprol HCTZ.  I called in the prescription and notified the patient.

## 2016-09-09 ENCOUNTER — Other Ambulatory Visit: Payer: Self-pay | Admitting: Osteopathic Medicine

## 2016-09-09 DIAGNOSIS — I10 Essential (primary) hypertension: Secondary | ICD-10-CM

## 2016-10-05 ENCOUNTER — Ambulatory Visit (INDEPENDENT_AMBULATORY_CARE_PROVIDER_SITE_OTHER): Payer: 59 | Admitting: Osteopathic Medicine

## 2016-10-05 VITALS — BP 136/80 | HR 66 | Wt 249.0 lb

## 2016-10-05 DIAGNOSIS — I10 Essential (primary) hypertension: Secondary | ICD-10-CM

## 2016-10-05 MED ORDER — LISINOPRIL-HYDROCHLOROTHIAZIDE 20-25 MG PO TABS: 2.0000 | ORAL_TABLET | Freq: Every day | ORAL | 0 refills | Status: DC

## 2016-10-05 NOTE — Progress Notes (Signed)
BP 136/80   Pulse 66   Wt 249 lb (112.9 kg)   BMI 32.85 kg/m   BP looks good, ok to follow up as instructed.

## 2016-10-05 NOTE — Progress Notes (Signed)
Pt came into clinic toady for [place] BP check. Pt was advised he had to come in for check to get med refills, he is not due to follow up with PCP until January 2019. Pt reports no negative side effects from his medications, does deny flu shot. Pt's BP was at goal. Advised we would contact him if there were to be any changes and to schedule his follow up. Verbalized understanding, no further questions.

## 2016-10-19 ENCOUNTER — Encounter: Payer: Self-pay | Admitting: Osteopathic Medicine

## 2016-10-26 ENCOUNTER — Other Ambulatory Visit: Payer: Self-pay | Admitting: Osteopathic Medicine

## 2016-10-26 ENCOUNTER — Ambulatory Visit
Admission: RE | Admit: 2016-10-26 | Discharge: 2016-10-26 | Disposition: A | Discharge status: Home / Self Care | Payer: No Typology Code available for payment source | Source: Ambulatory Visit | Attending: Occupational Medicine | Admitting: Occupational Medicine

## 2016-10-26 ENCOUNTER — Other Ambulatory Visit: Payer: Self-pay | Admitting: Occupational Medicine

## 2016-10-26 DIAGNOSIS — Z0000 Encounter for general adult medical examination without abnormal findings: Secondary | ICD-10-CM

## 2016-10-26 DIAGNOSIS — I10 Essential (primary) hypertension: Secondary | ICD-10-CM

## 2016-10-27 LAB — CBC AND DIFFERENTIAL
HCT: 48 (ref 41–53)
HEMOGLOBIN: 15.8 (ref 13.5–17.5)
PLATELETS: 239 (ref 150–399)
WBC: 4.6

## 2016-10-27 LAB — BASIC METABOLIC PANEL
BUN: 13 (ref 4–21)
Creatinine: 1 (ref 0.6–1.3)
Glucose: 101

## 2016-10-27 LAB — HEPATIC FUNCTION PANEL
ALT: 28 (ref 10–40)
AST: 31 (ref 14–40)

## 2016-10-27 LAB — LIPID PANEL
Cholesterol: 215 — AB (ref 0–200)
HDL: 50 (ref 35–70)
LDL CALC: 132
TRIGLYCERIDES: 166 — AB (ref 40–160)

## 2016-10-27 LAB — TSH: TSH: 1.62 (ref 0.41–5.90)

## 2016-10-28 ENCOUNTER — Other Ambulatory Visit: Payer: Self-pay | Admitting: Occupational Medicine

## 2016-10-28 ENCOUNTER — Ambulatory Visit
Admission: RE | Admit: 2016-10-28 | Discharge: 2016-10-28 | Disposition: A | Discharge status: Home / Self Care | Payer: No Typology Code available for payment source | Source: Ambulatory Visit | Attending: Occupational Medicine | Admitting: Occupational Medicine

## 2016-10-28 DIAGNOSIS — Z0289 Encounter for other administrative examinations: Secondary | ICD-10-CM

## 2016-11-25 ENCOUNTER — Ambulatory Visit: Payer: Self-pay | Admitting: Osteopathic Medicine

## 2016-11-25 DIAGNOSIS — Z0189 Encounter for other specified special examinations: Secondary | ICD-10-CM

## 2016-12-12 ENCOUNTER — Other Ambulatory Visit: Payer: Self-pay | Admitting: Osteopathic Medicine

## 2016-12-12 DIAGNOSIS — I10 Essential (primary) hypertension: Secondary | ICD-10-CM

## 2017-03-08 ENCOUNTER — Other Ambulatory Visit: Payer: Self-pay

## 2017-03-08 ENCOUNTER — Other Ambulatory Visit: Payer: Self-pay | Admitting: Osteopathic Medicine

## 2017-03-08 DIAGNOSIS — I10 Essential (primary) hypertension: Secondary | ICD-10-CM

## 2017-03-09 ENCOUNTER — Other Ambulatory Visit: Payer: Self-pay | Admitting: Osteopathic Medicine

## 2017-03-09 ENCOUNTER — Other Ambulatory Visit: Payer: Self-pay

## 2017-03-09 DIAGNOSIS — I10 Essential (primary) hypertension: Secondary | ICD-10-CM

## 2017-04-11 ENCOUNTER — Other Ambulatory Visit: Payer: Self-pay | Admitting: Osteopathic Medicine

## 2017-04-11 DIAGNOSIS — I10 Essential (primary) hypertension: Secondary | ICD-10-CM

## 2017-04-12 ENCOUNTER — Other Ambulatory Visit: Payer: Self-pay | Admitting: Osteopathic Medicine

## 2017-04-12 DIAGNOSIS — I10 Essential (primary) hypertension: Secondary | ICD-10-CM

## 2017-05-15 ENCOUNTER — Other Ambulatory Visit: Payer: Self-pay | Admitting: Osteopathic Medicine

## 2017-05-15 DIAGNOSIS — I10 Essential (primary) hypertension: Secondary | ICD-10-CM

## 2017-06-03 ENCOUNTER — Other Ambulatory Visit: Payer: Self-pay | Admitting: Osteopathic Medicine

## 2017-06-03 DIAGNOSIS — I10 Essential (primary) hypertension: Secondary | ICD-10-CM

## 2017-06-08 ENCOUNTER — Ambulatory Visit: Payer: 59 | Admitting: Osteopathic Medicine

## 2017-06-08 ENCOUNTER — Encounter: Payer: Self-pay | Admitting: Osteopathic Medicine

## 2017-06-08 VITALS — BP 127/87 | HR 66 | Temp 98.0°F | Wt 263.9 lb

## 2017-06-08 DIAGNOSIS — R7301 Impaired fasting glucose: Secondary | ICD-10-CM

## 2017-06-08 DIAGNOSIS — Z23 Encounter for immunization: Secondary | ICD-10-CM | POA: Diagnosis not present

## 2017-06-08 DIAGNOSIS — Z131 Encounter for screening for diabetes mellitus: Secondary | ICD-10-CM | POA: Diagnosis not present

## 2017-06-08 DIAGNOSIS — I10 Essential (primary) hypertension: Secondary | ICD-10-CM | POA: Diagnosis not present

## 2017-06-08 NOTE — Patient Instructions (Signed)
Plan:  Need records of labs and physical done in 10/2016  If you get a similar exam this year, have them forward [place] the records/results and if blood pressure is okay at that visit, you won't need a follow-up here until refills needed in one year from now (05/2018). Records can be faxed to [phone].

## 2017-06-08 NOTE — Progress Notes (Signed)
HPI: Nicholas Le is a 47 y.o. male  who presents to [facility] today, 06/08/17,  for chief complaint of:  BP check - routine f/u   Essential hypertension: Patient here for refill on medications and recheck blood pressure. Blood pressure improved with addition of amlodipine last year. No home blood pressures report. No chest pain, pressure, signs of breath. Had physical last fall w/ labs through work, we need records but he reports everything was okay.   Hx abn fasting Glc, last A1C okay   Past medical, surgical, social and family history reviewed: Patient Active Problem List   Diagnosis Date Noted  . 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 Chest pain 06/20/2014  . Essential hypertension, benign 04/05/2013  . SHOULDER PAIN, LEFT 12/14/2008   Past Surgical History:  Procedure Laterality Date  . SHOULDER SURGERY     Social History   Tobacco Use  . Smoking status: Never Smoker  . Smokeless tobacco: Never Used  Substance Use Topics  . Alcohol use: No   Family History  Problem Relation Age of Onset  . Diabetes Mother   . Stroke Mother   . Hypertension Mother      Current medication list and allergy/intolerance information reviewed:   Current Outpatient Medications on File Prior to Visit  Medication Sig Dispense Refill  . amLODipine (NORVASC) 5 MG tablet Take 1 tablet (5 mg total) by mouth daily. Pt needs labs & f/u apptw/ PCP for refills 15 tablet 0  . lisinopril-hydrochlorothiazide (PRINZIDE,ZESTORETIC) 20-25 MG tablet Take 2 tablets by mouth daily. Pt must keep appt scheduled on 06/08/17 for refills 15 tablet 0   No current facility-administered medications on file prior to visit.    No Known Allergies    Review of Systems:  Constitutional: No recent illness  HEENT: No  headache, no vision change  Cardiac: No  chest pain, No  pressure, No palpitations  Respiratory:  No  shortness of breath. No  Cough  Neurologic: No  weakness, No   Dizziness   Exam:  BP 127/87 (BP Location: Left Arm, Patient Position: Sitting, Cuff Size: Large)   Pulse 66   Temp 98 F (36.7 C) (Oral)   Wt 263 lb 14.4 oz (119.7 kg)   BMI 34.82 kg/m   Constitutional: VS see above. General Appearance: alert, well-developed, well-nourished, NAD   Eyes: Normal lids and conjunctive, non-icteric sclera  Ears, Nose, Mouth, Throat: MMM, Normal external inspection ears/nares/mouth/lips/gums.  Neck: No masses, trachea midline.   Respiratory: Normal respiratory effort. no wheeze, no rhonchi, no rales  Cardiovascular: S1/S2 normal, no murmur, no rub/gallop auscultated. RRR.   Musculoskeletal: Gait normal. Symmetric and independent movement of all extremities  Neurological: Normal balance/coordination. No tremor.  Skin: warm, dry, intact.   Psychiatric: Normal judgment/insight. Normal mood and affect. Oriented x3.     ASSESSMENT/PLAN:   Essential hypertension, benign  Abnormal fasting glucose  Diabetes mellitus screening  Need for Tdap vaccination - Plan: Tdap vaccine greater than or equal to 7yo IM      Patient Instructions  Plan:  Need records of labs and physical done in 10/2016  If you get a similar exam this year, have them forward [place] the records/results and if blood pressure is okay at that visit, you won't need a follow-up here until refills needed in one year from now (05/2018). Records can be faxed to [phone].      Visit summary with medication list and pertinent instructions was printed for patient to review.  All questions at time of visit were answered - patient instructed to contact office with any additional concerns. ER/RTC precautions were reviewed with the patient. Follow-up plan: Return for refills/BP check one year (05/2018) as long as we get records from annual labs/checkup in the fall .

## 2017-06-10 ENCOUNTER — Telehealth: Payer: Self-pay | Admitting: Osteopathic Medicine

## 2017-06-10 NOTE — Telephone Encounter (Signed)
Left a brief voicemail for patient to return call back to office regarding provider's note.

## 2017-06-10 NOTE — Telephone Encounter (Signed)
These call patient: I reviewed lab results, drawn September 2018.  No major concerns other than glucose was 101, cholesterol was borderline.  If sugar is high on next blood draw, they should do additional testing for prediabetes.  If cholesterol continues to be elevated, at some point may need to consider cholesterol medicine but no need at this time.

## 2017-06-10 NOTE — Telephone Encounter (Signed)
Left VM for Pt to return clinic call regarding results, callback information provided. 

## 2017-06-11 ENCOUNTER — Encounter: Payer: Self-pay | Admitting: Osteopathic Medicine

## 2017-06-11 NOTE — Telephone Encounter (Signed)
Pt has been updated, agrees w/provider's recommendation. No other concerns/questions during call.

## 2017-06-17 ENCOUNTER — Other Ambulatory Visit: Payer: Self-pay | Admitting: Osteopathic Medicine

## 2017-06-17 DIAGNOSIS — I10 Essential (primary) hypertension: Secondary | ICD-10-CM

## 2017-06-17 MED ORDER — AMLODIPINE BESYLATE 5 MG PO TABS: 5.0000 mg | ORAL_TABLET | Freq: Every day | ORAL | 3 refills | Status: DC

## 2018-07-10 ENCOUNTER — Other Ambulatory Visit: Payer: Self-pay | Admitting: Osteopathic Medicine

## 2018-07-10 DIAGNOSIS — I10 Essential (primary) hypertension: Secondary | ICD-10-CM

## 2018-08-10 ENCOUNTER — Other Ambulatory Visit: Payer: Self-pay | Admitting: Osteopathic Medicine

## 2018-08-10 DIAGNOSIS — I10 Essential (primary) hypertension: Secondary | ICD-10-CM

## 2018-08-28 ENCOUNTER — Other Ambulatory Visit: Payer: Self-pay | Admitting: Osteopathic Medicine

## 2018-08-28 DIAGNOSIS — I10 Essential (primary) hypertension: Secondary | ICD-10-CM

## 2018-10-18 ENCOUNTER — Other Ambulatory Visit: Payer: Self-pay

## 2018-10-18 DIAGNOSIS — I10 Essential (primary) hypertension: Secondary | ICD-10-CM

## 2018-10-18 MED ORDER — AMLODIPINE BESYLATE 5 MG PO TABS: 5.0000 mg | ORAL_TABLET | Freq: Every day | ORAL | 0 refills | Status: DC

## 2018-10-18 MED ORDER — LISINOPRIL-HYDROCHLOROTHIAZIDE 20-25 MG PO TABS
2.0000 | ORAL_TABLET | Freq: Every day | ORAL | 0 refills | Status: DC
Start: 2018-10-18 — End: 2018-10-31

## 2018-10-25 ENCOUNTER — Ambulatory Visit (INDEPENDENT_AMBULATORY_CARE_PROVIDER_SITE_OTHER): Payer: 59 | Admitting: Osteopathic Medicine

## 2018-10-25 ENCOUNTER — Other Ambulatory Visit: Payer: Self-pay

## 2018-10-25 ENCOUNTER — Encounter: Payer: Self-pay | Admitting: Osteopathic Medicine

## 2018-10-25 VITALS — BP 123/75 | HR 71 | Temp 98.0°F | Wt 271.8 lb

## 2018-10-25 DIAGNOSIS — I10 Essential (primary) hypertension: Secondary | ICD-10-CM

## 2018-10-25 DIAGNOSIS — Z0000 Encounter for general adult medical examination without abnormal findings: Secondary | ICD-10-CM

## 2018-10-25 DIAGNOSIS — R21 Rash and other nonspecific skin eruption: Secondary | ICD-10-CM | POA: Diagnosis not present

## 2018-10-25 DIAGNOSIS — L918 Other hypertrophic disorders of the skin: Secondary | ICD-10-CM

## 2018-10-25 MED ORDER — CLOTRIMAZOLE-BETAMETHASONE 1-0.05 % EX CREA: 1.0000 "application " | TOPICAL_CREAM | Freq: Two times a day (BID) | CUTANEOUS | 1 refills | Status: DC

## 2018-10-25 NOTE — Patient Instructions (Addendum)
General Preventive Care  Routine labs: ordered today   BP: goals 130/80 or under   Tobacco: don't!   Alcohol: responsible moderation is ok for most adults - if you have concerns about your alcohol intake, please talk to me!   Exercise: as tolerated to reduce risk of cardiovascular disease and diabetes. Strength training will also prevent osteoporosis.   Mental health: if need for mental health care (medicines, counseling, other), or concerns about moods, please let me know!   Sexual health: if need for STD testing, or if concerns with libido/pain problems, please let me know!   Advanced Directive: Living Will and/or Healthcare Power of Attorney recommended for all adults, regardless of age or health.  Vaccines  Flu vaccine: recommended for almost everyone, every fall.   Shingles vaccine: Shingrix recommended after age 34.   Pneumonia vaccines: Prevnar and Pneumovax recommended after age 49, or sooner if certain medical conditions or if smoker.   Tetanus booster: Tdap recommended every 10 years. Due 2029.  Cancer screenings   Colon cancer screening: recommended for everyone at age 52-75  Prostate cancer screening: PSA blood test around age 65  Lung cancer screening: not needed for non-smokers  Infection screenings . HIV, Gonorrhea/Chlamydia: screening as needed . Hepatitis C: recommended for anyone born 70-1967 (not needed for you) . TB: certain at-risk populations Other . Bone Density Test: recommended for men at age 47, sooner depending on risk factors . Abdominal Aortic Aneurysm: screening with ultrasound recommended once for men age 9-75 who have ever smoked (100 cigarettes or more!)

## 2018-10-25 NOTE — Progress Notes (Signed)
HPI: Nicholas Le is a 48 y.o. male who  has a past medical history of Hypertension.  he presents to [facility] today, 10/25/18,  for chief complaint of: Annual Check-up  HTN Skin tags  No concerns today other than couple of skin tags  Previous screening colonoscopy no records immediately available    Past medical, surgical, social and family history reviewed:  Patient Active Problem List   Diagnosis Date Noted  . 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 Chest pain 06/20/2014  . Essential hypertension, benign 04/05/2013  . SHOULDER PAIN, LEFT 12/14/2008    Past Surgical History:  Procedure Laterality Date  . SHOULDER SURGERY      Social History   Tobacco Use  . Smoking status: Never Smoker  . Smokeless tobacco: Never Used  Substance Use Topics  . Alcohol use: No    Family History  Problem Relation Age of Onset  . Diabetes Mother   . Stroke Mother   . Hypertension Mother      Current medication list and allergy/intolerance information reviewed:    Current Outpatient Medications  Medication Sig Dispense Refill  . amLODipine (NORVASC) 5 MG tablet Take 1 tablet (5 mg total) by mouth daily. Due for follow up visit w/PCP 7 tablet 0  . lisinopril-hydrochlorothiazide (ZESTORETIC) 20-25 MG tablet Take 2 tablets by mouth daily. No refills. Due for follow up visit w/PCP. Must keep upcoming appt. 15 tablet 0  . clotrimazole-betamethasone (LOTRISONE) cream Apply 1 application topically 2 (two) times daily. 15 g 1   No current facility-administered medications for this visit.     No Known Allergies    Review of Systems:  Constitutional:  No  fever, no chills, No recent illness   HEENT: No  headache, no vision change  Cardiac: No  chest pain, No  pressure, No palpitations,  Respiratory:  No  shortness of breath. No  Cough  Gastrointestinal: No  abdominal pain, No  nausea, No  vomiting,  No  blood in stool, No  diarrhea, No   constipation   Musculoskeletal: No new myalgia/arthralgia  Skin: No  Rash, + skin tags, no other wounds/concerning lesions  Genitourinary: No  incontinence, No  abnormal genital bleeding, No abnormal genital discharge  Hem/Onc: No  easy bruising/bleeding  Endocrine: No cold intolerance,  No heat intolerance.   Neurologic: No  weakness, No  dizziness  Psychiatric: No  concerns with depression, No  concerns with anxiety, No sleep problems, No mood problems  Exam:  BP 123/75 (BP Location: Left Arm, Patient Position: Sitting, Cuff Size: Large)   Pulse 71   Temp 98 F (36.7 C) (Oral)   Wt 271 lb 12.8 oz (123.3 kg)   BMI 35.86 kg/m   Constitutional: VS see above. General Appearance: alert, well-developed, well-nourished, NAD  Eyes: Normal lids and conjunctive, non-icteric sclera  Ears, Nose, Mouth, Throat:TM normal bilaterally.  Neck: No masses, trachea midline. No thyroid enlargement. No tenderness/mass appreciated. No lymphadenopathy  Respiratory: Normal respiratory effort. no wheeze, no rhonchi, no rales  Cardiovascular: S1/S2 normal, no murmur, no rub/gallop auscultated. RRR. No lower extremity edema.   Gastrointestinal: Nontender, no masses. No hepatomegaly, no splenomegaly. No hernia appreciated. Bowel sounds normal. Rectal exam deferred.   Musculoskeletal: Gait normal. No clubbing/cyanosis of digits.   Neurological: Normal balance/coordination. No tremor. No cranial nerve deficit on limited exam. Motor and sensation intact and symmetric. Cerebellar reflexes intact.   Skin: warm, dry, intact. No rash/ulcer. No concerning nevi or subq nodules on  limited exam.  +skin tags on neck, rough pathc of skin on forehead at hairline   Psychiatric: Normal judgment/insight. Normal mood and affect. Oriented x3.    No results found for this or any previous visit (from the past 72 hour(s)).  No results found.   ASSESSMENT/PLAN: The primary encounter diagnosis was Annual physical  exam. Diagnoses of Essential hypertension, Skin tag, and Rash and nonspecific skin eruption were also pertinent to this visit.   Trial Lotrisone for rash, itching. Seems less likely actinic damage but possible. Monitor   Skin tags removed x2 on upper L chest and upper L shoulder/base of L neck   Labs today, if ok can refill Rx x1 year   Pt to get colonoscopy records to us   Orders Placed This Encounter  Procedures  . CBC  . COMPLETE METABOLIC PANEL WITH GFR  . Lipid panel    Meds ordered this encounter  Medications  . clotrimazole-betamethasone (LOTRISONE) cream    Sig: Apply 1 application topically 2 (two) times daily.    Dispense:  15 g    Refill:  1    Patient Instructions  General Preventive Care  Routine labs: ordered today   BP: goals 130/80 or under   Tobacco: don't!   Alcohol: responsible moderation is ok for most adults - if you have concerns about your alcohol intake, please talk to me!   Exercise: as tolerated to reduce risk of cardiovascular disease and diabetes. Strength training will also prevent osteoporosis.   Mental health: if need for mental health care (medicines, counseling, other), or concerns about moods, please let me know!   Sexual health: if need for STD testing, or if concerns with libido/pain problems, please let me know!   Advanced Directive: Living Will and/or Healthcare Power of Attorney recommended for all adults, regardless of age or health.  Vaccines  Flu vaccine: recommended for almost everyone, every fall.   Shingles vaccine: Shingrix recommended after age 50.   Pneumonia vaccines: Prevnar and Pneumovax recommended after age 50, or sooner if certain medical conditions or if smoker.   Tetanus booster: Tdap recommended every 10 years. Due 2029.  Cancer screenings   Colon cancer screening: recommended for everyone at age 50-75  Prostate cancer screening: PSA blood test around age 50  Lung cancer screening: not needed for  non-smokers  Infection screenings . HIV, Gonorrhea/Chlamydia: screening as needed . Hepatitis C: recommended for anyone born 211945-1967 (not needed for you) . TB: certain at-risk populations Other . Bone Density Test: recommended for men at age 50, sooner depending on risk factors . Abdominal Aortic Aneurysm: screening with ultrasound recommended once for men age 50-75 who have ever smoked (100 cigarettes or more!)        Visit summary with medication list and pertinent instructions was printed for patient to review. All questions at time of visit were answered - patient instructed to contact office with any additional concerns or updates. ER/RTC precautions were reviewed with the patient.     Please note: voice recognition software was used to produce this document, and typos may escape review. Please contact Dr. Lyn HollingsheadAlexander for any needed clarifications.     Follow-up plan: Return in about 1 year (around 10/25/2019) for [place] (call week prior to visit for lab orders).

## 2018-10-28 ENCOUNTER — Other Ambulatory Visit: Payer: Self-pay | Admitting: Osteopathic Medicine

## 2018-10-28 DIAGNOSIS — I10 Essential (primary) hypertension: Secondary | ICD-10-CM

## 2018-10-28 NOTE — Telephone Encounter (Signed)
Requested medication (s) are due for refill today: yes  Requested medication (s) are on the active medication list: yes  Last refill:  10/18/2018  Future visit scheduled: no  Notes to clinic:  Review qty for refill   Requested Prescriptions  Pending Prescriptions Disp Refills   lisinopril-hydrochlorothiazide (ZESTORETIC) 20-25 MG tablet [Pharmacy Med Name: LISINOPRIL-HCTZ 20-25 MG TAB] 15 tablet 0    Sig: TAKE 2 TABLETS BY MOUTH DAILY. NO REFILLS. DUE FOR FOLLOW UP VISIT W/PCP. MUST KEEP UPCOMING APPT.     Cardiovascular:  ACEI + Diuretic Combos Failed - 10/28/2018 12:45 PM      Failed - Valid encounter within last 6 months    Recent Outpatient Visits          3 days ago Annual physical exam   [place] [facility], Lanelle Bal, DO   1 year ago Essential hypertension, benign   [place] [facility], Lanelle Bal, DO   2 years ago Essential hypertension, benign   [place] [facility], Lanelle Bal, DO   2 years ago Essential hypertension, benign   [place] [facility], Lanelle Bal, DO   2 years ago Essential hypertension, benign   [place] [facility], Lanelle Bal, DO             Passed - Na in normal range and within 180 days    Sodium  Date Value Ref Range Status  10/25/2018 139 135 - 146 mmol/L Final         Passed - K in normal range and within 180 days    Potassium  Date Value Ref Range Status  10/25/2018 3.8 3.5 - 5.3 mmol/L Final         Passed - Cr in normal range and within 180 days    Creat  Date Value Ref Range Status  10/25/2018 1.08 0.70 - 1.33 mg/dL Final    Comment:    For patients >20 years of age, the reference limit for Creatinine is approximately 13% higher for people identified as African-American. .          Passed - Ca in normal range and within 180 days    Calcium  Date Value  Ref Range Status  10/25/2018 9.6 8.6 - 10.3 mg/dL Final         Passed - Patient is not pregnant      Passed - Last BP in normal range    BP Readings from Last 1 Encounters:  10/25/18 123/75          amLODipine (NORVASC) 5 MG tablet [Pharmacy Med Name: AMLODIPINE BESYLATE 5 MG TAB] 7 tablet 0    Sig: TAKE 1 TABLET (5 MG TOTAL) BY MOUTH DAILY. DUE FOR FOLLOW UP VISIT W/PCP     Cardiovascular:  Calcium Channel Blockers Failed - 10/28/2018 12:45 PM      Failed - Valid encounter within last 6 months    Recent Outpatient Visits          3 days ago Annual physical exam   [place] [facility], Lanelle Bal, DO   1 year ago Essential hypertension, benign   [place] [facility], Lanelle Bal, DO   2 years ago Essential hypertension, benign   [place] [facility], Lanelle Bal, DO   2 years ago Essential hypertension, benign   [place]  [facility] [place] [facility], Dorene GrebeNatalie, DO   2 years ago Essential hypertension, benign   [place] [facility] [place] [facility], [place], DO             Passed - Last BP in normal range    BP Readings from Last 1 Encounters:  10/25/18 123/75

## 2018-10-29 LAB — COMPLETE METABOLIC PANEL WITH GFR
AG Ratio: 1.5 (calc) (ref 1.0–2.5)
ALT: 44 U/L (ref 9–46)
AST: 32 U/L (ref 10–35)
Albumin: 4.4 g/dL (ref 3.6–5.1)
Alkaline phosphatase (APISO): 58 U/L (ref 35–144)
BUN: 15 mg/dL (ref 7–25)
CO2: 26 mmol/L (ref 20–32)
Calcium: 9.6 mg/dL (ref 8.6–10.3)
Chloride: 100 mmol/L (ref 98–110)
Creat: 1.08 mg/dL (ref 0.70–1.33)
GFR, Est African American: 92 mL/min/{1.73_m2} (ref >=60)
GFR, Est Non African American: 80 mL/min/{1.73_m2} (ref >=60)
Globulin: 3 g/dL (calc) (ref 1.9–3.7)
Glucose, Bld: 102 mg/dL — ABNORMAL HIGH (ref 65–99)
Potassium: 3.8 mmol/L (ref 3.5–5.3)
Sodium: 139 mmol/L (ref 135–146)
Total Bilirubin: 0.6 mg/dL (ref 0.2–1.2)
Total Protein: 7.4 g/dL (ref 6.1–8.1)

## 2018-10-29 LAB — CBC
HCT: 47.3 % (ref 38.5–50.0)
Hemoglobin: 15.4 g/dL (ref 13.2–17.1)
MCH: 25.5 pg — ABNORMAL LOW (ref 27.0–33.0)
MCHC: 32.6 g/dL (ref 32.0–36.0)
MCV: 78.4 fL — ABNORMAL LOW (ref 80.0–100.0)
MPV: 10.1 fL (ref 7.5–12.5)
Platelets: 274 10*3/uL (ref 140–400)
RBC: 6.03 10*6/uL — ABNORMAL HIGH (ref 4.20–5.80)
RDW: 14.6 % (ref 11.0–15.0)
WBC: 5.3 10*3/uL (ref 3.8–10.8)

## 2018-10-29 LAB — LIPID PANEL
Cholesterol: 200 mg/dL — ABNORMAL HIGH (ref <200)
HDL: 51 mg/dL (ref >=40)
LDL Cholesterol (Calc): 124 mg/dL (calc) — ABNORMAL HIGH
Non-HDL Cholesterol (Calc): 149 mg/dL (calc) — ABNORMAL HIGH (ref <130)
Total CHOL/HDL Ratio: 3.9 (calc) (ref <5.0)
Triglycerides: 135 mg/dL (ref <150)

## 2018-10-29 LAB — HEMOGLOBIN A1C W/OUT EAG: Hgb A1c MFr Bld: 5.7 % of total Hgb — ABNORMAL HIGH (ref <5.7)

## 2018-11-02 ENCOUNTER — Encounter: Payer: Self-pay | Admitting: Physician Assistant

## 2018-11-02 DIAGNOSIS — R7303 Prediabetes: Secondary | ICD-10-CM | POA: Insufficient documentation

## 2018-11-02 NOTE — Progress Notes (Signed)
Call pt: A1C is 5.7. pre-diabetes. Start working on diet changes such as limiting sugars and carbs. Encourage 150 minutes of exercise a week. Dr. Loni Muse will continue to monitor this for any changes as if diet changes not made can progress into diabetes.

## 2019-04-26 ENCOUNTER — Ambulatory Visit: Payer: 59 | Admitting: Osteopathic Medicine

## 2019-11-09 ENCOUNTER — Other Ambulatory Visit: Payer: Self-pay | Admitting: Osteopathic Medicine

## 2019-11-09 DIAGNOSIS — I10 Essential (primary) hypertension: Secondary | ICD-10-CM

## 2020-06-04 ENCOUNTER — Ambulatory Visit (INDEPENDENT_AMBULATORY_CARE_PROVIDER_SITE_OTHER): Payer: 59 | Admitting: Family Medicine

## 2020-06-04 ENCOUNTER — Encounter: Payer: Self-pay | Admitting: Family Medicine

## 2020-06-04 VITALS — BP 121/78 | HR 78 | Temp 98.9°F | Wt 260.1 lb

## 2020-06-04 DIAGNOSIS — R12 Heartburn: Secondary | ICD-10-CM | POA: Diagnosis not present

## 2020-06-04 DIAGNOSIS — L853 Xerosis cutis: Secondary | ICD-10-CM

## 2020-06-04 DIAGNOSIS — R0789 Other chest pain: Secondary | ICD-10-CM | POA: Diagnosis not present

## 2020-06-04 MED ORDER — PANTOPRAZOLE SODIUM 40 MG PO TBEC: 40.0000 mg | DELAYED_RELEASE_TABLET | Freq: Every day | ORAL | 1 refills | Status: DC

## 2020-06-04 NOTE — Progress Notes (Signed)
Acute Office Visit  Subjective:    Patient ID: Nicholas Le, male    DOB: 01/20/1971, 50 y.o.   MRN: [id number]  Chief Complaint  Patient presents with  . Heartburn    HPI Patient is in today for heartburn.  Patient reporting a heartburn sensation for the past week or so. When describing he points the epigastric region (moving hand up and down center of chest saying it is about a 3-4/10 pain/pressure that lasts about an hour and is relieved after taking Tums. He has not had any recent diet changes, but states he does like spicy foods. However, he has not noticed any correlation of the pain to meal types or times. States the discomfort improves with Tums and standing or reclining, but worsens with leaning forward or hunching over. He also reports occasionally getting some left chest discomfort, but it doesn't always correlate with the epigastric pain. He uses his finger to point to his left chest stating it feels like someone is pressing on his chest but just with one finger. He typically only notices it when he is sitting still, not preoccupied by anything else. It tends to resolve within an hour or he just forgets about it and it is no longer noticeable. He said it isn't really a pain and he has a hard time describing it. He does not have any radiation of pain to arm or jaw, no nausea, vomiting, diarrhea, shortness of breath, dizziness, syncope, vision changes, diaphoresis.  He has not noticed any correlation with activity, meals, or certain times of day.    Patient also reporting occasionally episodes of dry/itchy skin to his back. Reports happens every day or so and turns red per his wife. He says it happens whether he is hot or cold. No new foods, soaps, detergents. No other rashes. Not happening today. He said it usually goes away pretty quickly if he lies on his back.    Past Medical History:  Diagnosis Date  . Hypertension     Past Surgical History:  Procedure Laterality Date  .  SHOULDER SURGERY      Family History  Problem Relation Age of Onset  . Diabetes Mother   . Stroke Mother   . Hypertension Mother     Social History   Socioeconomic History  . Marital status: Married    Spouse name: Not on file  . Number of children: Not on file  . Years of education: Not on file  . Highest education level: Not on file  Occupational History  . Not on file  Tobacco Use  . Smoking status: Never Smoker  . Smokeless tobacco: Never Used  Substance and Sexual Activity  . Alcohol use: No  . Drug use: No  . Sexual activity: Yes    Partners: Female  Other Topics Concern  . Not on file  Social History Narrative  . Not on file   Social Determinants of Health   Financial Resource Strain: Not on file  Food Insecurity: Not on file  Transportation Needs: Not on file  Physical Activity: Not on file  Stress: Not on file  Social Connections: Not on file  Intimate Partner Violence: Not on file    Outpatient Medications Prior to Visit  Medication Sig Dispense Refill  . amLODipine (NORVASC) 5 MG tablet TAKE 1 TABLET BY MOUTH EVERY DAY 30 tablet 11  . clotrimazole-betamethasone (LOTRISONE) cream Apply 1 application topically 2 (two) times daily. 15 g 1  . lisinopril-hydrochlorothiazide (ZESTORETIC) 20-25 MG  tablet TAKE 2 TABLETS BY MOUTH EVERY DAY 60 tablet 11   No facility-administered medications prior to visit.    No Known Allergies  Review of Systems All review of systems negative except what is listed in the HPI     Objective:    Physical Exam Constitutional:      General: He is not in acute distress.    Appearance: Normal appearance. He is normal weight.  HENT:     Head: Normocephalic and atraumatic.  Cardiovascular:     Rate and Rhythm: Normal rate and regular rhythm.     Pulses: Normal pulses.     Heart sounds: Normal heart sounds.  Pulmonary:     Effort: Pulmonary effort is normal.     Breath sounds: Normal breath sounds.  Abdominal:      General: Abdomen is flat. Bowel sounds are normal. There is no distension.     Palpations: Abdomen is soft.     Tenderness: There is no abdominal tenderness. There is no guarding or rebound.     Hernia: No hernia is present.  Musculoskeletal:        General: Normal range of motion.  Skin:    General: Skin is warm and dry.     Capillary Refill: Capillary refill takes less than 2 seconds.     Findings: No rash.  Neurological:     General: No focal deficit present.     Mental Status: He is alert and oriented to person, place, and time. Mental status is at baseline.  Psychiatric:        Mood and Affect: Mood normal.        Behavior: Behavior normal.        Thought Content: Thought content normal.        Judgment: Judgment normal.     There were no vitals taken for this visit. Wt Readings from Last 3 Encounters:  10/25/18 271 lb 12.8 oz (123.3 kg)  06/08/17 263 lb 14.4 oz (119.7 kg)  10/05/16 249 lb (112.9 kg)    Health Maintenance Due  Topic Date Due  . Hepatitis C Screening  Never done  . HIV Screening  Never done  . COLONOSCOPY (Pts 45-71yrs Insurance coverage will need to be confirmed)  Never done  . COVID-19 Vaccine (3 - Moderna risk 4-dose series) 01/28/2020    There are no preventive care reminders to display for this patient.   Lab Results  Component Value Date   TSH 1.62 10/27/2016   Lab Results  Component Value Date   WBC 5.3 10/25/2018   HGB 15.4 10/25/2018   HCT 47.3 10/25/2018   MCV 78.4 (L) 10/25/2018   PLT 274 10/25/2018   Lab Results  Component Value Date   NA 139 10/25/2018   K 3.8 10/25/2018   CO2 26 10/25/2018   GLUCOSE 102 (H) 10/25/2018   BUN 15 10/25/2018   CREATININE 1.08 10/25/2018   BILITOT 0.6 10/25/2018   ALKPHOS 71 12/04/2015   AST 32 10/25/2018   ALT 44 10/25/2018   PROT 7.4 10/25/2018   ALBUMIN 4.1 12/04/2015   CALCIUM 9.6 10/25/2018   Lab Results  Component Value Date   CHOL 200 (H) 10/25/2018   Lab Results  Component  Value Date   HDL 51 10/25/2018   Lab Results  Component Value Date   LDLCALC 124 (H) 10/25/2018   Lab Results  Component Value Date   TRIG 135 10/25/2018   Lab Results  Component Value Date  CHOLHDL 3.9 10/25/2018   Lab Results  Component Value Date   HGBA1C 5.7 (H) 10/25/2018       Assessment & Plan:   1. Atypical chest pain No true patterns to chest discomfort. EKG today with NSR and no signs of ischemia. Will go ahead and check cardiac enzymes. Encouraged him to keep an eye on his symptoms, frequency, intensity, patterns, etc. Discussed considering further workup if no improvement with GERD management or worsening/changing symptoms - can consider stress test, echo, cardiology referral, etc as needed. Patient aware of signs and symptoms requiring urgent evaluation including chest pain, shortness of breath, radiation of pain, fatigue, nausea, dizziness, diaphoresis, etc.  - EKG 12-Lead - Troponin I - CK Total (and CKMB)  2. Heartburn For epigastric pain, will start with trial of daily Protonix. Handout provided with information on potential triggers and signs and symptoms requiring further evaluation.  - pantoprazole (PROTONIX) 40 MG tablet; Take 1 tablet (40 mg total) by mouth daily.  Dispense: 30 tablet; Refill: 1  3. Dry skin dermatitis Will start conservatively. Recommending he apply a gentle lotion after showering every day. He can use occasional hydrocortisone cream when it flares up. Encouraged him to monitor when it happens and think about what he has been eating, wearing, soaps/detergents, animals/environmental exposures. If no improvement with conservative measures, encouraged him to try to come in when it is happening so we can better evaluate. Can consider allergy testing, biopsy, medication management, etc pending evaluation.  Educated on signs and symptoms requiring further evaluation. Follow-up if symptoms worsen or fail to improve.   Clayborne Dana, NP

## 2020-06-04 NOTE — Patient Instructions (Addendum)
For back, dry/itchy skin try keeping area hydrated with lotion after a shower and use occasional hydrocortisone cream. If not helping, please come in when it is happening so we can evaluate.   Nonspecific Chest Pain Chest pain can be caused by many different conditions. Some causes of chest pain can be life-threatening. These will require treatment right away. Serious causes of chest pain include:  Heart attack.  A tear in the body's main blood vessel.  Redness and swelling (inflammation) around your heart.  Blood clot in your lungs. Other causes of chest pain may not be so serious. These include:  Heartburn.  Anxiety or stress.  Damage to bones or muscles in your chest.  Lung infections. Chest pain can feel like:  Pain or discomfort in your chest.  Crushing, pressure, aching, or squeezing pain.  Burning or tingling.  Dull or sharp pain that is worse when you move, cough, or take a deep breath.  Pain or discomfort that is also felt in your back, neck, jaw, shoulder, or arm, or pain that spreads to any of these areas. It is hard to know whether your pain is caused by something that is serious or something that is not so serious. So it is important to see your doctor right away if you have chest pain. Follow these instructions at home: Medicines  Take over-the-counter and prescription medicines only as told by your doctor.  If you were prescribed an antibiotic medicine, take it as told by your doctor. Do not stop taking the antibiotic even if you start to feel better. Lifestyle  Rest as told by your doctor.  Do not use any products that contain nicotine or tobacco, such as cigarettes, e-cigarettes, and chewing tobacco. If you need help quitting, ask your doctor.  Do not drink alcohol.  Make lifestyle changes as told by your doctor. These may include: ? Getting regular exercise. Ask your doctor what activities are safe for you. ? Eating a heart-healthy diet. A diet and  nutrition specialist (dietitian) can help you to learn healthy eating options. ? Staying at a healthy weight. ? Treating diabetes or high blood pressure, if needed. ? Lowering your stress. Activities such as yoga and relaxation techniques can help.   General instructions  Pay attention to any changes in your symptoms. Tell your doctor about them or any new symptoms.  Avoid any activities that cause chest pain.  Keep all follow-up visits as told by your doctor. This is important. You may need more testing if your chest pain does not go away. Contact a doctor if:  Your chest pain does not go away.  You feel depressed.  You have a fever. Get help right away if:  Your chest pain is worse.  You have a cough that gets worse, or you cough up blood.  You have very bad (severe) pain in your belly (abdomen).  You pass out (faint).  You have either of these for no clear reason: ? Sudden chest discomfort. ? Sudden discomfort in your arms, back, neck, or jaw.  You have shortness of breath at any time.  You suddenly start to sweat, or your skin gets clammy.  You feel sick to your stomach (nauseous).  You throw up (vomit).  You suddenly feel lightheaded or dizzy.  You feel very weak or tired.  Your heart starts to beat fast, or it feels like it is skipping beats. These symptoms may be an emergency. Do not wait to see if the symptoms will  go away. Get medical help right away. Call your local emergency services (911 in the [place] Do not drive yourself to the hospital. Summary  Chest pain can be caused by many different conditions. The cause may be serious and need treatment right away. If you have chest pain, see your doctor right away.  Follow your doctor's instructions for taking medicines and making lifestyle changes.  Keep all follow-up visits as told by your doctor. This includes visits for any further testing if your chest pain does not go away.  Be sure to know the signs  that show that your condition has become worse. Get help right away if you have these symptoms. This information is not intended to replace advice given to you by your health care provider. Make sure you discuss any questions you have with your health care provider. Document Revised: 07/29/2017 Document Reviewed: 07/29/2017 Elsevier Patient Education  2021 [employer] Gastroesophageal Reflux Disease, Adult  Gastroesophageal reflux (GER) happens when acid from the stomach flows up into the tube that connects the mouth and the stomach (esophagus). Normally, food travels down the esophagus and stays in the stomach to be digested. With GER, food and stomach acid sometimes move back up into the esophagus. You may have a disease called gastroesophageal reflux disease (GERD) if the reflux:  Happens often.  Causes frequent or very bad symptoms.  Causes problems such as damage to the esophagus. When this happens, the esophagus becomes sore and swollen. Over time, GERD can make small holes (ulcers) in the lining of the esophagus. What are the causes? This condition is caused by a problem with the muscle between the esophagus and the stomach. When this muscle is weak or not normal, it does not close properly to keep food and acid from coming back up from the stomach. The muscle can be weak because of:  Tobacco use.  Pregnancy.  Having a certain type of hernia (hiatal hernia).  Alcohol use.  Certain foods and drinks, such as coffee, chocolate, onions, and peppermint. What increases the risk?  Being overweight.  Having a disease that affects your connective tissue.  Taking NSAIDs, such a ibuprofen. What are the signs or symptoms?  Heartburn.  Difficult or painful swallowing.  The feeling of having a lump in the throat.  A bitter taste in the mouth.  Bad breath.  Having a lot of saliva.  Having an upset or bloated stomach.  Burping.  Chest pain. Different conditions can  cause chest pain. Make sure you see your doctor if you have chest pain.  Shortness of breath or wheezing.  A long-term cough or a cough at night.  Wearing away of the surface of teeth (tooth enamel).  Weight loss. How is this treated?  Making changes to your diet.  Taking medicine.  Having surgery. Treatment will depend on how bad your symptoms are. Follow these instructions at home: Eating and drinking  Follow a diet as told by your doctor. You may need to avoid foods and drinks such as: ? Coffee and tea, with or without caffeine. ? Drinks that contain alcohol. ? Energy drinks and sports drinks. ? Bubbly (carbonated) drinks or sodas. ? Chocolate and cocoa. ? Peppermint and mint flavorings. ? Garlic and onions. ? Horseradish. ? Spicy and acidic foods. These include peppers, chili powder, curry powder, vinegar, hot sauces, and BBQ sauce. ? Citrus fruit juices and citrus fruits, such as oranges, lemons, and limes. ? Tomato-based foods. These include red sauce, chili, salsa, and  pizza with red sauce. ? Fried and fatty foods. These include donuts, french fries, potato chips, and high-fat dressings. ? High-fat meats. These include hot dogs, rib eye steak, sausage, ham, and bacon. ? High-fat dairy items, such as whole milk, butter, and cream cheese.  Eat small meals often. Avoid eating large meals.  Avoid drinking large amounts of liquid with your meals.  Avoid eating meals during the 2-3 hours before bedtime.  Avoid lying down right after you eat.  Do not exercise right after you eat.   Lifestyle  Do not smoke or use any products that contain nicotine or tobacco. If you need help quitting, ask your doctor.  Try to lower your stress. If you need help doing this, ask your doctor.  If you are overweight, lose an amount of weight that is healthy for you. Ask your doctor about a safe weight loss goal.   General instructions  Pay attention to any changes in your  symptoms.  Take over-the-counter and prescription medicines only as told by your doctor.  Do not take aspirin, ibuprofen, or other NSAIDs unless your doctor says it is okay.  Wear loose clothes. Do not wear anything tight around your waist.  Raise (elevate) the head of your bed about 6 inches (15 cm). You may need to use a wedge to do this.  Avoid bending over if this makes your symptoms worse.  Keep all follow-up visits. Contact a doctor if:  You have new symptoms.  You lose weight and you do not know why.  You have trouble swallowing or it hurts to swallow.  You have wheezing or a cough that keeps happening.  You have a hoarse voice.  Your symptoms do not get better with treatment. Get help right away if:  You have sudden pain in your arms, neck, jaw, teeth, or back.  You suddenly feel sweaty, dizzy, or light-headed.  You have chest pain or shortness of breath.  You vomit and the vomit is green, yellow, or black, or it looks like blood or coffee grounds.  You faint.  Your poop (stool) is red, bloody, or black.  You cannot swallow, drink, or eat. These symptoms may represent a serious problem that is an emergency. Do not wait to see if the symptoms will go away. Get medical help right away. Call your local emergency services (911 in the [place] Do not drive yourself to the hospital. Summary  If a person has gastroesophageal reflux disease (GERD), food and stomach acid move back up into the esophagus and cause symptoms or problems such as damage to the esophagus.  Treatment will depend on how bad your symptoms are.  Follow a diet as told by your doctor.  Take all medicines only as told by your doctor. This information is not intended to replace advice given to you by your health care provider. Make sure you discuss any questions you have with your health care provider. Document Revised: 08/07/2019 Document Reviewed: 08/07/2019 Elsevier Patient Education  2021  [employer].

## 2020-06-05 LAB — CK TOTAL AND CKMB (NOT AT ARMC)
CK, MB: <0.7 ng/mL (ref 0–5.0)
Total CK: 186 U/L (ref 44–196)

## 2020-06-05 LAB — TROPONIN I: Troponin I: 3 ng/L (ref <=47)

## 2020-06-06 NOTE — Progress Notes (Signed)
Your lab work showed normal cardiac enzymes. Let's see if the new medicine for reflux makes a difference. If it does not, or if symptoms worsen or change please let us know. If persistent, we can do a cardiac stress test.

## 2020-11-29 ENCOUNTER — Other Ambulatory Visit: Payer: Self-pay | Admitting: Osteopathic Medicine

## 2020-11-29 DIAGNOSIS — I10 Essential (primary) hypertension: Secondary | ICD-10-CM

## 2020-12-27 ENCOUNTER — Other Ambulatory Visit: Payer: Self-pay | Admitting: Family Medicine

## 2020-12-27 DIAGNOSIS — I10 Essential (primary) hypertension: Secondary | ICD-10-CM

## 2021-02-19 ENCOUNTER — Other Ambulatory Visit: Payer: Self-pay | Admitting: Family Medicine

## 2021-02-19 DIAGNOSIS — I10 Essential (primary) hypertension: Secondary | ICD-10-CM

## 2021-03-18 ENCOUNTER — Other Ambulatory Visit: Payer: Self-pay | Admitting: Family Medicine

## 2021-03-18 DIAGNOSIS — I10 Essential (primary) hypertension: Secondary | ICD-10-CM

## 2021-03-25 ENCOUNTER — Other Ambulatory Visit: Payer: Self-pay

## 2021-03-25 ENCOUNTER — Ambulatory Visit: Payer: 59 | Admitting: Family Medicine

## 2021-03-25 ENCOUNTER — Encounter: Payer: Self-pay | Admitting: Family Medicine

## 2021-03-25 VITALS — BP 134/81 | HR 89 | Temp 98.1°F | Ht 73.0 in | Wt 281.0 lb

## 2021-03-25 DIAGNOSIS — Z1159 Encounter for screening for other viral diseases: Secondary | ICD-10-CM

## 2021-03-25 DIAGNOSIS — Z13220 Encounter for screening for lipoid disorders: Secondary | ICD-10-CM | POA: Diagnosis not present

## 2021-03-25 DIAGNOSIS — I10 Essential (primary) hypertension: Secondary | ICD-10-CM | POA: Diagnosis not present

## 2021-03-25 DIAGNOSIS — R7303 Prediabetes: Secondary | ICD-10-CM | POA: Diagnosis not present

## 2021-03-25 MED ORDER — AMLODIPINE BESYLATE 5 MG PO TABS: 5.0000 mg | ORAL_TABLET | Freq: Every day | ORAL | 3 refills | Status: DC

## 2021-03-25 MED ORDER — LISINOPRIL-HYDROCHLOROTHIAZIDE 20-25 MG PO TABS: 2.0000 | ORAL_TABLET | Freq: Every day | ORAL | 3 refills | Status: DC

## 2021-03-25 NOTE — Assessment & Plan Note (Signed)
Blood pressure remains well controlled.  Continue current medications for management of hypertension.  Updated lab work ordered today.

## 2021-03-25 NOTE — Assessment & Plan Note (Signed)
Update A1c today 

## 2021-03-25 NOTE — Progress Notes (Signed)
Nicholas Le - 51 y.o. male MRN [id number]  Date of birth: Jun 21, 1970  Subjective Chief Complaint  Patient presents with   Transitions Of Care    HPI Nicholas Le is a 51 year old male here today for follow-up visit.  He is a former patient of Dr. Lyn Hollingshead.  He has history of hypertension.  This has been well controlled with combination of amlodipine and lisinopril with hydrochlorothiazide.  He has not noted any side effects related to medication.  He has had chronic discomfort along the left upper chest for the past few years.  He denies shortness of breath or worsening with exertion.  He has had previous work-up for this which was unremarkable.  ROS:  A comprehensive ROS was completed and negative except as noted per HPI  No Known Allergies  Past Medical History:  Diagnosis Date   Hypertension     Past Surgical History:  Procedure Laterality Date   SHOULDER SURGERY      Social History   Socioeconomic History   Marital status: Married    Spouse name: Not on file   Number of children: Not on file   Years of education: Not on file   Highest education level: Not on file  Occupational History   Not on file  Tobacco Use   Smoking status: Never   Smokeless tobacco: Never  Substance and Sexual Activity   Alcohol use: No   Drug use: No   Sexual activity: Yes    Partners: Female  Other Topics Concern   Not on file  Social History Narrative   Not on file   Social Determinants of Health   Financial Resource Strain: Not on file  Food Insecurity: Not on file  Transportation Needs: Not on file  Physical Activity: Not on file  Stress: Not on file  Social Connections: Not on file    Family History  Problem Relation Age of Onset   Diabetes Mother    Stroke Mother    Hypertension Mother     Health Maintenance  Topic Date Due   HIV Screening  Never done   Hepatitis C Screening  Never done   COLONOSCOPY (Pts 45-74yrs Insurance coverage will need to be confirmed)  Never  done   INFLUENZA VACCINE  05/09/2021 (Originally 09/09/2020)   COVID-19 Vaccine (3 - Moderna risk series) 04/11/2022 (Originally 01/28/2020)   Zoster Vaccines- Shingrix (1 of 2) 06/23/2022 (Originally 06/30/1987)   TETANUS/TDAP  06/09/2027   HPV VACCINES  Aged Out     ----------------------------------------------------------------------------------------------------------------------------------------------------------------------------------------------------------------- Physical Exam BP 134/81 (BP Location: Left Arm, Patient Position: Sitting, Cuff Size: Large)    Pulse 89    Temp 98.1 F (36.7 C)    Ht 6\' 1"  (1.854 m)    Wt 281 lb (127.5 kg)    SpO2 98%    BMI 37.07 kg/m   Physical Exam Constitutional:      Appearance: Normal appearance.  Eyes:     General: No scleral icterus. Cardiovascular:     Rate and Rhythm: Normal rate and regular rhythm.  Pulmonary:     Effort: Pulmonary effort is normal.     Breath sounds: Normal breath sounds.  Musculoskeletal:     Cervical back: Neck supple.  Neurological:     Mental Status: He is alert.  Psychiatric:        Mood and Affect: Mood normal.        Behavior: Behavior normal.    ------------------------------------------------------------------------------------------------------------------------------------------------------------------------------------------------------------------- Assessment and Plan  Essential hypertension, benign Blood pressure remains well  controlled.  Continue current medications for management of hypertension.  Updated lab work ordered today.  Pre-diabetes Update A1c today.   Meds ordered this encounter  Medications   amLODipine (NORVASC) 5 MG tablet    Sig: Take 1 tablet (5 mg total) by mouth daily.    Dispense:  90 tablet    Refill:  3   lisinopril-hydrochlorothiazide (ZESTORETIC) 20-25 MG tablet    Sig: Take 2 tablets by mouth daily.    Dispense:  90 tablet    Refill:  3    Return in about  6 months (around 09/22/2021) for HTN.    This visit occurred during the SARS-CoV-2 public health emergency.  Safety protocols were in place, including screening questions prior to the visit, additional usage of staff PPE, and extensive cleaning of exam room while observing appropriate contact time as indicated for disinfecting solutions.

## 2021-03-26 LAB — CBC WITH DIFFERENTIAL/PLATELET
Absolute Monocytes: 857 cells/uL (ref 200–950)
Basophils Absolute: 68 cells/uL (ref 0–200)
Basophils Relative: 1 %
Eosinophils Absolute: 122 cells/uL (ref 15–500)
Eosinophils Relative: 1.8 %
HCT: 46.2 % (ref 38.5–50.0)
Hemoglobin: 15.2 g/dL (ref 13.2–17.1)
Lymphs Abs: 1394 cells/uL (ref 850–3900)
MCH: 25.5 pg — ABNORMAL LOW (ref 27.0–33.0)
MCHC: 32.9 g/dL (ref 32.0–36.0)
MCV: 77.6 fL — ABNORMAL LOW (ref 80.0–100.0)
MPV: 10.1 fL (ref 7.5–12.5)
Monocytes Relative: 12.6 %
Neutro Abs: 4359 cells/uL (ref 1500–7800)
Neutrophils Relative %: 64.1 %
Platelets: 294 10*3/uL (ref 140–400)
RBC: 5.95 10*6/uL — ABNORMAL HIGH (ref 4.20–5.80)
RDW: 14.5 % (ref 11.0–15.0)
Total Lymphocyte: 20.5 %
WBC: 6.8 10*3/uL (ref 3.8–10.8)

## 2021-03-26 LAB — HEPATITIS C ANTIBODY
Hepatitis C Ab: NONREACTIVE
SIGNAL TO CUT-OFF: 0.04 (ref <1.00)

## 2021-03-26 LAB — COMPLETE METABOLIC PANEL WITH GFR
AG Ratio: 1.4 (calc) (ref 1.0–2.5)
ALT: 37 U/L (ref 9–46)
AST: 21 U/L (ref 10–35)
Albumin: 4.4 g/dL (ref 3.6–5.1)
Alkaline phosphatase (APISO): 55 U/L (ref 35–144)
BUN: 16 mg/dL (ref 7–25)
CO2: 27 mmol/L (ref 20–32)
Calcium: 9.6 mg/dL (ref 8.6–10.3)
Chloride: 100 mmol/L (ref 98–110)
Creat: 1.11 mg/dL (ref 0.70–1.30)
Globulin: 3.1 g/dL (calc) (ref 1.9–3.7)
Glucose, Bld: 89 mg/dL (ref 65–99)
Potassium: 3.7 mmol/L (ref 3.5–5.3)
Sodium: 139 mmol/L (ref 135–146)
Total Bilirubin: 0.4 mg/dL (ref 0.2–1.2)
Total Protein: 7.5 g/dL (ref 6.1–8.1)
eGFR: 80 mL/min/{1.73_m2} (ref >=60)

## 2021-03-26 LAB — LIPID PANEL W/REFLEX DIRECT LDL
Cholesterol: 203 mg/dL — ABNORMAL HIGH (ref <200)
HDL: 49 mg/dL (ref >=40)
LDL Cholesterol (Calc): 121 mg/dL (calc) — ABNORMAL HIGH
Non-HDL Cholesterol (Calc): 154 mg/dL (calc) — ABNORMAL HIGH (ref <130)
Total CHOL/HDL Ratio: 4.1 (calc) (ref <5.0)
Triglycerides: 213 mg/dL — ABNORMAL HIGH (ref <150)

## 2021-03-26 LAB — HEMOGLOBIN A1C
Hgb A1c MFr Bld: 6.2 % of total Hgb — ABNORMAL HIGH (ref <5.7)
Mean Plasma Glucose: 131 mg/dL
eAG (mmol/L): 7.3 mmol/L

## 2021-09-22 ENCOUNTER — Encounter: Payer: Self-pay | Admitting: Family Medicine

## 2021-09-22 ENCOUNTER — Ambulatory Visit: Payer: 59 | Admitting: Family Medicine

## 2021-09-22 VITALS — BP 117/80 | HR 88 | Ht 73.0 in | Wt 277.0 lb

## 2021-09-22 DIAGNOSIS — Z1211 Encounter for screening for malignant neoplasm of colon: Secondary | ICD-10-CM | POA: Diagnosis not present

## 2021-09-22 DIAGNOSIS — I10 Essential (primary) hypertension: Secondary | ICD-10-CM | POA: Diagnosis not present

## 2021-09-22 DIAGNOSIS — Z3009 Encounter for other general counseling and advice on contraception: Secondary | ICD-10-CM | POA: Diagnosis not present

## 2021-09-22 DIAGNOSIS — L918 Other hypertrophic disorders of the skin: Secondary | ICD-10-CM | POA: Diagnosis not present

## 2021-09-22 NOTE — Patient Instructions (Signed)
Continue current medications    Follow up in 6 months

## 2021-09-22 NOTE — Assessment & Plan Note (Signed)
Treated with liquid nitrogen today.  See procedure note.  

## 2021-09-22 NOTE — Assessment & Plan Note (Signed)
Blood pressure well controlled at this time.  Recommend continuation of current medications for management of hypertension. 

## 2021-09-22 NOTE — Progress Notes (Signed)
Nicholas Le - 51 y.o. male MRN [id number]  Date of birth: Mar 28, 1970  Subjective Chief Complaint  Patient presents with   Hypertension    HPI Nicholas Le is a 51 year old male here today for follow-up visit.  Continues to do well with combination of lisinopril/hydrochlorothiazide and amlodipine for management of hypertension.  He has had a couple episodes where he is felt dizzy in the morning but this usually resolves pretty quickly.  He has not had any symptoms related to hypertension including chest pain, shortness of breath, palpitations, headaches or vision changes.  He does have a few skin tags he would like me to look at.  A couple of these are in the underarm area and are bothersome and painful.  ROS:  A comprehensive ROS was completed and negative except as noted per HPI  No Known Allergies  Past Medical History:  Diagnosis Date   Hypertension     Past Surgical History:  Procedure Laterality Date   SHOULDER SURGERY      Social History   Socioeconomic History   Marital status: Married    Spouse name: Not on file   Number of children: Not on file   Years of education: Not on file   Highest education level: Not on file  Occupational History   Not on file  Tobacco Use   Smoking status: Never   Smokeless tobacco: Never  Substance and Sexual Activity   Alcohol use: No   Drug use: No   Sexual activity: Yes    Partners: Female  Other Topics Concern   Not on file  Social History Narrative   Not on file   Social Determinants of Health   Financial Resource Strain: Not on file  Food Insecurity: Not on file  Transportation Needs: Not on file  Physical Activity: Not on file  Stress: Not on file  Social Connections: Not on file    Family History  Problem Relation Age of Onset   Diabetes Mother    Stroke Mother    Hypertension Mother     Health Maintenance  Topic Date Due   COVID-19 Vaccine (3 - Moderna risk series) 04/11/2022 (Originally 01/28/2020)    INFLUENZA VACCINE  05/10/2022 (Originally 09/09/2021)   Zoster Vaccines- Shingrix (1 of 2) 06/23/2022 (Originally 06/30/1987)   COLONOSCOPY (Pts 45-77yrs Insurance coverage will need to be confirmed)  09/23/2022 (Originally 06/29/2013)   HIV Screening  09/23/2022 (Originally 06/30/1983)   TETANUS/TDAP  06/09/2027   Hepatitis C Screening  Completed   HPV VACCINES  Aged Out     ----------------------------------------------------------------------------------------------------------------------------------------------------------------------------------------------------------------- Physical Exam BP 117/80 (BP Location: Left Arm, Patient Position: Sitting, Cuff Size: Large)   Pulse 88   Ht 6\' 1"  (1.854 m)   Wt 277 lb (125.6 kg)   SpO2 97%   BMI 36.55 kg/m   Physical Exam Constitutional:      Appearance: Normal appearance.  Eyes:     General: No scleral icterus. Cardiovascular:     Rate and Rhythm: Normal rate and regular rhythm.  Pulmonary:     Effort: Pulmonary effort is normal.     Breath sounds: Normal breath sounds.  Skin:    Comments: Pedunculated skin tags located on the underarm and back.  Neurological:     General: No focal deficit present.     Mental Status: He is alert.  Psychiatric:        Mood and Affect: Mood normal.        Behavior: Behavior normal.    Procedure: Treatment  of skin tags discussed using cryo surgery.  Potential complications reviewed.  Verbal consent given for treatment.  2 skin tags in left underarm treated initially with 2 mm Frost ring achieved.  Larger pedunculated skin tag on the right upper back treated in similar fashion.  He tolerated procedure well without any immediate complications.  Postprocedure instructions were given.  ROS:  A comprehensive ROS was completed and negative except as noted per  HPI  ------------------------------------------------------------------------------------------------------------------------------------------------------------------------------------------------------------------- Assessment and Plan  Essential hypertension, benign Blood pressure well controlled at this time.  Recommend continuation of current medications for management of hypertension.  Vasectomy evaluation Requesting referral for vasectomy.  Orders placed for urology referral.  Skin tag Treated with liquid nitrogen today.  See procedure note   No orders of the defined types were placed in this encounter.   Return in about 6 months (around 03/25/2022) for HTN.    This visit occurred during the SARS-CoV-2 public health emergency.  Safety protocols were in place, including screening questions prior to the visit, additional usage of staff PPE, and extensive cleaning of exam room while observing appropriate contact time as indicated for disinfecting solutions.

## 2021-09-22 NOTE — Progress Notes (Signed)
3 skin tags liquin n2

## 2021-09-22 NOTE — Assessment & Plan Note (Signed)
Requesting referral for vasectomy.  Orders placed for urology referral.

## 2021-10-08 LAB — HM COLONOSCOPY

## 2021-11-05 ENCOUNTER — Other Ambulatory Visit: Payer: Self-pay | Admitting: Family Medicine

## 2021-11-05 DIAGNOSIS — I10 Essential (primary) hypertension: Secondary | ICD-10-CM

## 2022-03-25 ENCOUNTER — Encounter: Payer: Self-pay | Admitting: Family Medicine

## 2022-03-25 ENCOUNTER — Ambulatory Visit: Payer: 59 | Admitting: Family Medicine

## 2022-03-25 VITALS — BP 123/81 | HR 74 | Ht 72.0 in | Wt 249.3 lb

## 2022-03-25 DIAGNOSIS — S76211A Strain of adductor muscle, fascia and tendon of right thigh, initial encounter: Secondary | ICD-10-CM

## 2022-03-25 DIAGNOSIS — R7303 Prediabetes: Secondary | ICD-10-CM

## 2022-03-25 DIAGNOSIS — Z13220 Encounter for screening for lipoid disorders: Secondary | ICD-10-CM | POA: Diagnosis not present

## 2022-03-25 DIAGNOSIS — I10 Essential (primary) hypertension: Secondary | ICD-10-CM | POA: Diagnosis not present

## 2022-03-25 DIAGNOSIS — Z125 Encounter for screening for malignant neoplasm of prostate: Secondary | ICD-10-CM | POA: Diagnosis not present

## 2022-03-25 DIAGNOSIS — S76219A Strain of adductor muscle, fascia and tendon of unspecified thigh, initial encounter: Secondary | ICD-10-CM | POA: Insufficient documentation

## 2022-03-25 MED ORDER — LISINOPRIL-HYDROCHLOROTHIAZIDE 20-25 MG PO TABS: 2.0000 | ORAL_TABLET | Freq: Every day | ORAL | 3 refills | Status: DC

## 2022-03-25 MED ORDER — AMLODIPINE BESYLATE 5 MG PO TABS: 5.0000 mg | ORAL_TABLET | Freq: Every day | ORAL | 3 refills | Status: DC

## 2022-03-25 NOTE — Progress Notes (Signed)
Nicholas Le - 52 y.o. male MRN [id number]  Date of birth: [birth year]  Subjective Chief Complaint  Patient presents with   Follow-up   Leg Pain    6 month f/u  Right leg pain    HPI Nicholas Le is a 52 y.o. male here today for follow up visit.   Continues on amlodipine and lisinopril/hctz for management of HTN.  Doing well with this and denies side effects from medication.  BP remains well controlled.  Has not had chest pain, shortness of breath,palpitations, headache or vision changes.   He is having R leg pain.  Pain located on the upper inner thigh.  Symptoms started a few months ago.  This is worsened by crossing his leg over his other leg to tie his shoe.  He has tried ibuprofen with some relief.  ROS:  A comprehensive ROS was completed and negative except as noted per HPI    No Known Allergies  Past Medical History:  Diagnosis Date   Hypertension     Past Surgical History:  Procedure Laterality Date   SHOULDER SURGERY      Social History   Socioeconomic History   Marital status: Married    Spouse name: Not on file   Number of children: Not on file   Years of education: Not on file   Highest education level: Not on file  Occupational History   Not on file  Tobacco Use   Smoking status: Never   Smokeless tobacco: Never  Substance and Sexual Activity   Alcohol use: No   Drug use: No   Sexual activity: Yes    Partners: Female  Other Topics Concern   Not on file  Social History Narrative   Not on file   Social Determinants of Health   Financial Resource Strain: Not on file  Food Insecurity: Not on file  Transportation Needs: Not on file  Physical Activity: Not on file  Stress: Not on file  Social Connections: Not on file    Family History  Problem Relation Age of Onset   Diabetes Mother    Stroke Mother    Hypertension Mother     Health Maintenance  Topic Date Due   COVID-19 Vaccine (3 - Moderna risk series) 04/11/2022 (Originally  01/28/2020)   INFLUENZA VACCINE  05/10/2022 (Originally 09/09/2021)   Zoster Vaccines- Shingrix (1 of 2) 06/23/2022 (Originally 06/30/1987)   COLONOSCOPY (Pts 45-38yr Insurance coverage will need to be confirmed)  09/23/2022 (Originally 06/29/2013)   HIV Screening  09/23/2022 (Originally 06/30/1983)   DTaP/Tdap/Td (2 - Td or Tdap) 06/09/2027   Hepatitis C Screening  Completed   HPV VACCINES  Aged Out     ----------------------------------------------------------------------------------------------------------------------------------------------------------------------------------------------------------------- Physical Exam BP 123/81 (BP Location: Left Arm, Patient Position: Sitting, Cuff Size: Normal)   Pulse 74   Ht 6' (1.829 m)   Wt 249 lb 4.8 oz (113.1 kg)   SpO2 100%   BMI 33.81 kg/m   Physical Exam Constitutional:      Appearance: Normal appearance.  HENT:     Head: Normocephalic and atraumatic.  Eyes:     General: No scleral icterus. Cardiovascular:     Rate and Rhythm: Normal rate and regular rhythm.  Pulmonary:     Effort: Pulmonary effort is normal.     Breath sounds: Normal breath sounds.  Musculoskeletal:     Comments: Mild tenderness to palpation along the right upper inner thigh.  Pain with internal rotation or crossing leg over the knee.  Neurological:  Mental Status: He is alert.  Psychiatric:        Mood and Affect: Mood normal.        Behavior: Behavior normal.     ------------------------------------------------------------------------------------------------------------------------------------------------------------------------------------------------------------------- Assessment and Plan  Groin strain Symptoms consistent with sartorius strain.  Given handout for rehab exercises.  He may continue ibuprofen as needed.  Contact clinic if symptoms or not improving.  Pre-diabetes Updated A1c ordered.  Essential hypertension, benign Doing well with  current antihypertensive regimen.  Recommend continuation of current medications.   Meds ordered this encounter  Medications   amLODipine (NORVASC) 5 MG tablet    Sig: Take 1 tablet (5 mg total) by mouth daily.    Dispense:  90 tablet    Refill:  3   lisinopril-hydrochlorothiazide (ZESTORETIC) 20-25 MG tablet    Sig: Take 2 tablets by mouth daily.    Dispense:  180 tablet    Refill:  3    No follow-ups on file.    This visit occurred during the SARS-CoV-2 public health emergency.  Safety protocols were in place, including screening questions prior to the visit, additional usage of staff PPE, and extensive cleaning of exam room while observing appropriate contact time as indicated for disinfecting solutions.

## 2022-03-25 NOTE — Assessment & Plan Note (Signed)
Doing well with current antihypertensive regimen.  Recommend continuation of current medications.

## 2022-03-25 NOTE — Assessment & Plan Note (Signed)
Updated A1c ordered.

## 2022-03-25 NOTE — Assessment & Plan Note (Signed)
Symptoms consistent with sartorius strain.  Given handout for rehab exercises.  He may continue ibuprofen as needed.  Contact clinic if symptoms or not improving.

## 2022-03-25 NOTE — Patient Instructions (Signed)
Let me know if groin/thigh pain is not improving after 4-6 weeks of home exercise program

## 2022-04-07 ENCOUNTER — Other Ambulatory Visit: Payer: Self-pay | Admitting: Family Medicine

## 2022-04-07 DIAGNOSIS — I10 Essential (primary) hypertension: Secondary | ICD-10-CM

## 2022-05-21 LAB — COMPLETE METABOLIC PANEL WITH GFR
AG Ratio: 1.5 (calc) (ref 1.0–2.5)
ALT: 18 U/L (ref 9–46)
AST: 17 U/L (ref 10–35)
Albumin: 4.5 g/dL (ref 3.6–5.1)
Alkaline phosphatase (APISO): 59 U/L (ref 35–144)
BUN: 17 mg/dL (ref 7–25)
CO2: 31 mmol/L (ref 20–32)
Calcium: 10 mg/dL (ref 8.6–10.3)
Chloride: 100 mmol/L (ref 98–110)
Creat: 0.87 mg/dL (ref 0.70–1.30)
Globulin: 3 g/dL (calc) (ref 1.9–3.7)
Glucose, Bld: 98 mg/dL (ref 65–99)
Potassium: 4.3 mmol/L (ref 3.5–5.3)
Sodium: 142 mmol/L (ref 135–146)
Total Bilirubin: 0.7 mg/dL (ref 0.2–1.2)
Total Protein: 7.5 g/dL (ref 6.1–8.1)
eGFR: 103 mL/min/{1.73_m2} (ref >=60)

## 2022-05-21 LAB — CBC WITH DIFFERENTIAL/PLATELET
Absolute Monocytes: 621 cells/uL (ref 200–950)
Basophils Absolute: 51 cells/uL (ref 0–200)
Basophils Relative: 1.1 %
Eosinophils Absolute: 92 cells/uL (ref 15–500)
Eosinophils Relative: 2 %
HCT: 47.9 % (ref 38.5–50.0)
Hemoglobin: 15.4 g/dL (ref 13.2–17.1)
Lymphs Abs: 1017 cells/uL (ref 850–3900)
MCH: 25.1 pg — ABNORMAL LOW (ref 27.0–33.0)
MCHC: 32.2 g/dL (ref 32.0–36.0)
MCV: 78.1 fL — ABNORMAL LOW (ref 80.0–100.0)
MPV: 9.8 fL (ref 7.5–12.5)
Monocytes Relative: 13.5 %
Neutro Abs: 2820 cells/uL (ref 1500–7800)
Neutrophils Relative %: 61.3 %
Platelets: 276 10*3/uL (ref 140–400)
RBC: 6.13 10*6/uL — ABNORMAL HIGH (ref 4.20–5.80)
RDW: 15 % (ref 11.0–15.0)
Total Lymphocyte: 22.1 %
WBC: 4.6 10*3/uL (ref 3.8–10.8)

## 2022-05-21 LAB — HEMOGLOBIN A1C
Hgb A1c MFr Bld: 5.8 % of total Hgb — ABNORMAL HIGH (ref <5.7)
Mean Plasma Glucose: 120 mg/dL
eAG (mmol/L): 6.6 mmol/L

## 2022-05-21 LAB — LIPID PANEL W/REFLEX DIRECT LDL
Cholesterol: 206 mg/dL — ABNORMAL HIGH (ref <200)
HDL: 53 mg/dL (ref >=40)
LDL Cholesterol (Calc): 134 mg/dL (calc) — ABNORMAL HIGH
Non-HDL Cholesterol (Calc): 153 mg/dL (calc) — ABNORMAL HIGH (ref <130)
Total CHOL/HDL Ratio: 3.9 (calc) (ref <5.0)
Triglycerides: 90 mg/dL (ref <150)

## 2022-05-21 LAB — PSA: PSA: 0.95 ng/mL (ref <=4.00)

## 2023-04-17 ENCOUNTER — Other Ambulatory Visit: Payer: Self-pay | Admitting: Family Medicine

## 2023-04-17 DIAGNOSIS — I10 Essential (primary) hypertension: Secondary | ICD-10-CM

## 2023-04-18 ENCOUNTER — Other Ambulatory Visit: Payer: Self-pay | Admitting: Family Medicine

## 2023-04-18 DIAGNOSIS — I10 Essential (primary) hypertension: Secondary | ICD-10-CM

## 2023-06-18 ENCOUNTER — Other Ambulatory Visit: Payer: Self-pay | Admitting: Family Medicine

## 2023-06-18 DIAGNOSIS — I10 Essential (primary) hypertension: Secondary | ICD-10-CM

## 2023-06-21 ENCOUNTER — Telehealth: Payer: Self-pay

## 2023-06-21 NOTE — Telephone Encounter (Signed)
 Patient scheduled for physical for 06/28/23  wanting to know if labs are needed for the physical and if so if they could be submitted to be drawn before appt ?

## 2023-06-21 NOTE — Telephone Encounter (Signed)
 Pls contact the pt to schedule HTN appt with Dr. Augustus Ledger. Past due follow-up. Sending 30 day refill. No additional. Thx.

## 2023-06-21 NOTE — Telephone Encounter (Signed)
 Copied from CRM [id number]. Topic: Appointments - Appointment Scheduling >> Jun 21, 2023 11:19 AM Tiffany H wrote: Patient/patient representative is calling to schedule an appointment. Refer to attachments for appointment information.    Patient takes Lisinopril  - if labs are needed for annual physical, please submit order and follow up with patient for scheduling adjustment.

## 2023-06-21 NOTE — Telephone Encounter (Signed)
 Called patient left detailed message to call back and schedule appt

## 2023-06-23 ENCOUNTER — Other Ambulatory Visit: Payer: Self-pay | Admitting: Family Medicine

## 2023-06-23 DIAGNOSIS — Z125 Encounter for screening for malignant neoplasm of prostate: Secondary | ICD-10-CM

## 2023-06-23 DIAGNOSIS — I10 Essential (primary) hypertension: Secondary | ICD-10-CM

## 2023-06-23 DIAGNOSIS — Z13220 Encounter for screening for lipoid disorders: Secondary | ICD-10-CM

## 2023-06-23 DIAGNOSIS — R7303 Prediabetes: Secondary | ICD-10-CM

## 2023-06-23 DIAGNOSIS — Z0000 Encounter for general adult medical examination without abnormal findings: Secondary | ICD-10-CM

## 2023-06-24 ENCOUNTER — Telehealth: Payer: Self-pay

## 2023-06-24 NOTE — Telephone Encounter (Signed)
 Patient informed that lab orders in chart but he is currently in [place] and will just prepare to come in fasting for upcoming appt on Monday 06/28/23

## 2023-06-24 NOTE — Telephone Encounter (Signed)
 Copied from CRM [id number]. Topic: Appointments - Appointment Scheduling >> Jun 21, 2023 11:19 AM Tiffany H wrote: Patient/patient representative is calling to schedule an appointment. Refer to attachments for appointment information.    Patient takes Lisinopril  - if labs are needed for annual physical, please submit order and follow up with patient for scheduling adjustment. >> Jun 24, 2023 10:24 AM Retta Caster wrote: Patient retruning office call. Let patient know Lab orders in and ready for him to do labs day of visit for 05/19 on Physical.

## 2023-06-24 NOTE — Telephone Encounter (Signed)
 Attempted call to patient to inform him that lab orders are in chart . He can walk in for lab work to be drawn at his convenience. Lab hours are 8 -11:30 am and 1pm- 4:30pm - had to leave a voice mail message requesting a return call.

## 2023-06-28 ENCOUNTER — Encounter: Admitting: Family Medicine

## 2023-07-17 ENCOUNTER — Other Ambulatory Visit: Payer: Self-pay | Admitting: Family Medicine

## 2023-07-17 DIAGNOSIS — I10 Essential (primary) hypertension: Secondary | ICD-10-CM

## 2023-07-19 NOTE — Telephone Encounter (Signed)
 Called patient, LVM to call office to schedule appt, thanks.

## 2023-07-19 NOTE — Telephone Encounter (Signed)
 Pls contact pt to schedule HTN appt with Dr. Augustus Ledger. Last seen 03/25/2022. No med refills available. Thx.

## 2023-07-29 ENCOUNTER — Other Ambulatory Visit: Payer: Self-pay | Admitting: Family Medicine

## 2023-07-29 DIAGNOSIS — I10 Essential (primary) hypertension: Secondary | ICD-10-CM

## 2023-07-29 NOTE — Telephone Encounter (Unsigned)
 Copied from CRM [id number]. Topic: Clinical - Medication Refill >> Jul 29, 2023  3:13 PM [name] wrote: Medication: lisinopril -hydrochlorothiazide  (ZESTORETIC ) 20-25 MG tablet  Has the patient contacted their pharmacy? Yes (Agent: If no, request that the patient contact the pharmacy for the refill. If patient does not wish to contact the pharmacy document the reason why and proceed with request.) (Agent: If yes, when and what did the pharmacy advise?)  This is the patient's preferred pharmacy:  [id number] [name], [place] - [address] [name] [facility] [address] Phone: [phone] Fax: [phone]  Is this the correct pharmacy for this prescription? Yes If no, delete pharmacy and type the correct one.   Has the prescription been filled recently? No  Is the patient out of the medication? Yes  Has the patient been seen for an appointment in the last year OR does the patient have an upcoming appointment? Yes  Can we respond through MyChart? yes  Agent: Please be advised that Rx refills may take up to 3 business days. We ask that you follow-up with your pharmacy.

## 2023-07-29 NOTE — Telephone Encounter (Signed)
 Pls contact the pt to schedule appt with Dr. Augustus Ledger. Unable to refill medications. Last OV: 03/2022

## 2023-07-29 NOTE — Telephone Encounter (Signed)
Called patient, LVM to call office to schedule appointment, thanks.  

## 2023-07-29 NOTE — Telephone Encounter (Signed)
 Patient scheduled for 6/26 for physical, thanks.

## 2023-07-30 MED ORDER — LISINOPRIL-HYDROCHLOROTHIAZIDE 20-25 MG PO TABS
2.0000 | ORAL_TABLET | Freq: Every day | ORAL | 0 refills | Status: DC
Start: 2023-07-30 — End: 2023-08-17

## 2023-08-05 ENCOUNTER — Encounter: Payer: Self-pay | Admitting: Family Medicine

## 2023-08-05 ENCOUNTER — Ambulatory Visit (INDEPENDENT_AMBULATORY_CARE_PROVIDER_SITE_OTHER): Admitting: Family Medicine

## 2023-08-05 VITALS — BP 113/77 | HR 71 | Ht 72.0 in | Wt 237.0 lb

## 2023-08-05 DIAGNOSIS — I10 Essential (primary) hypertension: Secondary | ICD-10-CM | POA: Diagnosis not present

## 2023-08-05 DIAGNOSIS — Z0000 Encounter for general adult medical examination without abnormal findings: Secondary | ICD-10-CM | POA: Diagnosis not present

## 2023-08-05 NOTE — Assessment & Plan Note (Signed)
 Well adult Labs completed today.  Screenings: per lab orders Immunization:   Anticipatory guidance/Risk factor reduction:  Recommendations per AVS.

## 2023-08-05 NOTE — Patient Instructions (Addendum)
 Reduce lisinopril /hydrochlorothiazide  to 1 tab daily.  Continue amlodipine .    Preventive Care 10-55 Years Old, Male Preventive care refers to lifestyle choices and visits with your health care provider that can promote health and wellness. Preventive care visits are also called wellness exams. What can I expect for my preventive care visit? Counseling During your preventive care visit, your health care provider may ask about your: Medical history, including: Past medical problems. Family medical history. Current health, including: Emotional well-being. Home life and relationship well-being. Sexual activity. Lifestyle, including: Alcohol, nicotine or tobacco, and drug use. Access to firearms. Diet, exercise, and sleep habits. Safety issues such as seatbelt and bike helmet use. Sunscreen use. Work and work Astronomer. Physical exam Your health care provider will check your: Height and weight. These may be used to calculate your BMI (body mass index). BMI is a measurement that tells if you are at a healthy weight. Waist circumference. This measures the distance around your waistline. This measurement also tells if you are at a healthy weight and may help predict your risk of certain diseases, such as type 2 diabetes and high blood pressure. Heart rate and blood pressure. Body temperature. Skin for abnormal spots. What immunizations do I need?  Vaccines are usually given at various ages, according to a schedule. Your health care provider will recommend vaccines for you based on your age, medical history, and lifestyle or other factors, such as travel or where you work. What tests do I need? Screening Your health care provider may recommend screening tests for certain conditions. This may include: Lipid and cholesterol levels. Diabetes screening. This is done by checking your blood sugar (glucose) after you have not eaten for a while (fasting). Hepatitis B test. Hepatitis C  test. HIV (human immunodeficiency virus) test. STI (sexually transmitted infection) testing, if you are at risk. Lung cancer screening. Prostate cancer screening. Colorectal cancer screening. Talk with your health care provider about your test results, treatment options, and if necessary, the need for more tests. Follow these instructions at home: Eating and drinking  Eat a diet that includes fresh fruits and vegetables, whole grains, lean protein, and low-fat dairy products. Take vitamin and mineral supplements as recommended by your health care provider. Do not drink alcohol if your health care provider tells you not to drink. If you drink alcohol: Limit how much you have to 0-2 drinks a day. Know how much alcohol is in your drink. In the [place] one drink equals one 12 oz bottle of beer (355 mL), one 5 oz glass of wine (148 mL), or one 1 oz glass of hard liquor (44 mL). Lifestyle Brush your teeth every morning and night with fluoride toothpaste. Floss one time each day. Exercise for at least 30 minutes 5 or more days each week. Do not use any products that contain nicotine or tobacco. These products include cigarettes, chewing tobacco, and vaping devices, such as e-cigarettes. If you need help quitting, ask your health care provider. Do not use drugs. If you are sexually active, practice safe sex. Use a condom or other form of protection to prevent STIs. Take aspirin only as told by your health care provider. Make sure that you understand how much to take and what form to take. Work with your health care provider to find out whether it is safe and beneficial for you to take aspirin daily. Find healthy ways to manage stress, such as: Meditation, yoga, or listening to music. Journaling. Talking to a trusted person. Spending  time with friends and family. Minimize exposure to UV radiation to reduce your risk of skin cancer. Safety Always wear your seat belt while driving or riding in a  vehicle. Do not drive: If you have been drinking alcohol. Do not ride with someone who has been drinking. When you are tired or distracted. While texting. If you have been using any mind-altering substances or drugs. Wear a helmet and other protective equipment during sports activities. If you have firearms in your house, make sure you follow all gun safety procedures. What's next? Go to your health care provider once a year for an annual wellness visit. Ask your health care provider how often you should have your eyes and teeth checked. Stay up to date on all vaccines. This information is not intended to replace advice given to you by your health care provider. Make sure you discuss any questions you have with your health care provider. Document Revised: 07/24/2020 Document Reviewed: 07/24/2020 Elsevier Patient Education  2024 [employer].

## 2023-08-05 NOTE — Assessment & Plan Note (Signed)
 Reducing lisinopril /hydrochlorothiazide  to 1 tab daily as he is having some dizziness at times.

## 2023-08-05 NOTE — Progress Notes (Signed)
 Nicholas Le - 53 y.o. male MRN [id number]  Date of birth: July 22, 1970  Subjective Chief Complaint  Patient presents with   Annual Exam    HPI Nicholas Le is a 53 y.o. male here today for annual exam.   He reports that he is doing well at this time.  He does not some occasional orthostasis when standing up too quickly.  He stays pretty active.  He feels that diet is pretty good.    He is a non-smoker. Denies EtOH use.   Review of Systems  Constitutional:  Negative for chills, fever, malaise/fatigue and weight loss.  HENT:  Negative for congestion, ear pain and sore throat.   Eyes:  Negative for blurred vision, double vision and pain.  Respiratory:  Negative for cough and shortness of breath.   Cardiovascular:  Negative for chest pain and palpitations.  Gastrointestinal:  Negative for abdominal pain, blood in stool, constipation, heartburn and nausea.  Genitourinary:  Negative for dysuria and urgency.  Musculoskeletal:  Negative for joint pain and myalgias.  Neurological:  Negative for dizziness and headaches.  Endo/Heme/Allergies:  Does not bruise/bleed easily.  Psychiatric/Behavioral:  Negative for depression. The patient is not nervous/anxious and does not have insomnia.     No Known Allergies  Past Medical History:  Diagnosis Date   Hypertension     Past Surgical History:  Procedure Laterality Date   SHOULDER SURGERY      Social History   Socioeconomic History   Marital status: Married    Spouse name: Not on file   Number of children: Not on file   Years of education: Not on file   Highest education level: Not on file  Occupational History   Not on file  Tobacco Use   Smoking status: Never   Smokeless tobacco: Never  Substance and Sexual Activity   Alcohol use: No   Drug use: No   Sexual activity: Yes    Partners: Female  Other Topics Concern   Not on file  Social History Narrative   Not on file   Social Drivers of Health   Financial Resource  Strain: Low Risk  (10/08/2021)   Received from [facility]   Overall Financial Resource Strain (CARDIA)    Difficulty of Paying Living Expenses: Not hard at all  Food Insecurity: No Food Insecurity (08/05/2023)   Hunger Vital Sign    Worried About Running Out of Food in the Last Year: Never true    Ran Out of Food in the Last Year: Never true  Transportation Needs: No Transportation Needs (08/05/2023)   PRAPARE - Administrator, Civil Service (Medical): No    Lack of Transportation (Non-Medical): No  Physical Activity: Insufficiently Active (10/08/2021)   Received from [facility]   Exercise Vital Sign    On average, how many days per week do you engage in moderate to strenuous exercise (like a brisk walk)?: 2 days    On average, how many minutes do you engage in exercise at this level?: 60 min  Stress: No Stress Concern Present (10/08/2021)   Received from [facility] - Occupational Stress Questionnaire    Feeling of Stress : Not at all  Social Connections: Unknown (10/17/2022)   Received from [facility]   Social Network    Social Network: Not on file    Family History  Problem Relation Age of Onset   Diabetes Mother    Stroke Mother  Hypertension Mother     Health Maintenance  Topic Date Due   HIV Screening  Never done   Hepatitis B Vaccines (1 of 3 - 19+ 3-dose series) Never done   Zoster Vaccines- Shingrix (1 of 2) Never done   COVID-19 Vaccine (3 - Moderna risk series) 01/28/2020   INFLUENZA VACCINE  09/10/2023   DTaP/Tdap/Td (2 - Td or Tdap) 06/09/2027   Colonoscopy  10/09/2031   Hepatitis C Screening  Completed   HPV VACCINES  Aged Out   Meningococcal B Vaccine  Aged Out     ----------------------------------------------------------------------------------------------------------------------------------------------------------------------------------------------------------------- Physical Exam BP  113/77 (BP Location: Left Arm, Patient Position: Sitting, Cuff Size: Large)   Pulse 71   Ht 6' (1.829 m)   Wt 237 lb (107.5 kg)   SpO2 97%   BMI 32.14 kg/m   Physical Exam Constitutional:      General: He is not in acute distress. HENT:     Head: Normocephalic and atraumatic.     Right Ear: Tympanic membrane and external ear normal.     Left Ear: Tympanic membrane and external ear normal.   Eyes:     General: No scleral icterus.  Neck:     Thyroid: No thyromegaly.   Cardiovascular:     Rate and Rhythm: Normal rate and regular rhythm.     Heart sounds: Normal heart sounds.  Pulmonary:     Effort: Pulmonary effort is normal.     Breath sounds: Normal breath sounds.  Abdominal:     General: Bowel sounds are normal. There is no distension.     Palpations: Abdomen is soft.     Tenderness: There is no abdominal tenderness. There is no guarding.   Musculoskeletal:     Cervical back: Normal range of motion.  Lymphadenopathy:     Cervical: No cervical adenopathy.   Skin:    General: Skin is warm and dry.     Findings: No rash.   Neurological:     Mental Status: He is alert and oriented to person, place, and time.     Cranial Nerves: No cranial nerve deficit.     Motor: No abnormal muscle tone.   Psychiatric:        Mood and Affect: Mood normal.        Behavior: Behavior normal.     ------------------------------------------------------------------------------------------------------------------------------------------------------------------------------------------------------------------- Assessment and Plan  Well adult exam Well adult Labs completed today.  Screenings: per lab orders Immunization:   Anticipatory guidance/Risk factor reduction:  Recommendations per AVS.   Essential hypertension, benign Reducing lisinopril /hydrochlorothiazide  to 1 tab daily as he is having some dizziness at times.    No orders of the defined types were placed in this  encounter.   No follow-ups on file.

## 2023-08-06 LAB — CBC WITH DIFFERENTIAL/PLATELET
Basophils Absolute: 0.1 10*3/uL (ref 0.0–0.2)
Basos: 1 %
EOS (ABSOLUTE): 0.1 10*3/uL (ref 0.0–0.4)
Eos: 2 %
Hematocrit: 48.9 % (ref 37.5–51.0)
Hemoglobin: 15.8 g/dL (ref 13.0–17.7)
Immature Grans (Abs): 0 10*3/uL (ref 0.0–0.1)
Immature Granulocytes: 0 %
Lymphocytes Absolute: 1.2 10*3/uL (ref 0.7–3.1)
Lymphs: 25 %
MCH: 26.7 pg (ref 26.6–33.0)
MCHC: 32.3 g/dL (ref 31.5–35.7)
MCV: 83 fL (ref 79–97)
Monocytes Absolute: 0.6 10*3/uL (ref 0.1–0.9)
Monocytes: 13 %
Neutrophils Absolute: 2.9 10*3/uL (ref 1.4–7.0)
Neutrophils: 59 %
Platelets: 285 10*3/uL (ref 150–450)
RBC: 5.92 x10E6/uL — ABNORMAL HIGH (ref 4.14–5.80)
RDW: 15.5 % — ABNORMAL HIGH (ref 11.6–15.4)
WBC: 4.9 10*3/uL (ref 3.4–10.8)

## 2023-08-06 LAB — CMP14+EGFR
ALT: 20 IU/L (ref 0–44)
AST: 18 IU/L (ref 0–40)
Albumin: 4.6 g/dL (ref 3.8–4.9)
Alkaline Phosphatase: 60 IU/L (ref 44–121)
BUN/Creatinine Ratio: 14 (ref 9–20)
BUN: 13 mg/dL (ref 6–24)
Bilirubin Total: 0.5 mg/dL (ref 0.0–1.2)
CO2: 25 mmol/L (ref 20–29)
Calcium: 10 mg/dL (ref 8.7–10.2)
Chloride: 97 mmol/L (ref 96–106)
Creatinine, Ser: 0.94 mg/dL (ref 0.76–1.27)
Globulin, Total: 2.8 g/dL (ref 1.5–4.5)
Glucose: 86 mg/dL (ref 70–99)
Potassium: 3.8 mmol/L (ref 3.5–5.2)
Sodium: 140 mmol/L (ref 134–144)
Total Protein: 7.4 g/dL (ref 6.0–8.5)
eGFR: 96 mL/min/{1.73_m2} (ref >59)

## 2023-08-06 LAB — LIPID PANEL WITH LDL/HDL RATIO
Cholesterol, Total: 219 mg/dL — ABNORMAL HIGH (ref 100–199)
HDL: 53 mg/dL (ref >39)
LDL Chol Calc (NIH): 141 mg/dL — ABNORMAL HIGH (ref 0–99)
LDL/HDL Ratio: 2.7 ratio (ref 0.0–3.6)
Triglycerides: 138 mg/dL (ref 0–149)
VLDL Cholesterol Cal: 25 mg/dL (ref 5–40)

## 2023-08-06 LAB — HEMOGLOBIN A1C
Est. average glucose Bld gHb Est-mCnc: 117 mg/dL
Hgb A1c MFr Bld: 5.7 % — ABNORMAL HIGH (ref 4.8–5.6)

## 2023-08-06 LAB — PSA: Prostate Specific Ag, Serum: 0.9 ng/mL (ref 0.0–4.0)

## 2023-08-15 ENCOUNTER — Ambulatory Visit: Payer: Self-pay | Admitting: Family Medicine

## 2023-08-17 ENCOUNTER — Other Ambulatory Visit: Payer: Self-pay | Admitting: Family Medicine

## 2023-08-17 DIAGNOSIS — I10 Essential (primary) hypertension: Secondary | ICD-10-CM

## 2023-08-17 NOTE — Telephone Encounter (Unsigned)
 Copied from CRM [id number]. Topic: Clinical - Medication Refill >> Aug 17, 2023  9:36 AM Vivian Z wrote: Medication: lisinopril -hydrochlorothiazide  (ZESTORETIC ) 20-25 MG tablet  Patient is requesting refills for entire year, he stated that he just had his yearly physical which is what he normally does and gets refills for a year.  Has the patient contacted their pharmacy? Yes (Agent: If no, request that the patient contact the pharmacy for the refill. If patient does not wish to contact the pharmacy document the reason why and proceed with request.) (Agent: If yes, when and what did the pharmacy advise?) Out of refills.  This is the patient's preferred pharmacy:  [id number] [address], [place] - [address] [place] [address] Phone: [phone] Fax: [phone]  Is this the correct pharmacy for this prescription? Yes If no, delete pharmacy and type the correct one.   Has the prescription been filled recently? No  Is the patient out of the medication? Yes  Has the patient been seen for an appointment in the last year OR does the patient have an upcoming appointment? Yes  Can we respond through MyChart? No  Agent: Please be advised that Rx refills may take up to 3 business days. We ask that you follow-up with your pharmacy.

## 2023-08-19 MED ORDER — LISINOPRIL-HYDROCHLOROTHIAZIDE 20-25 MG PO TABS: 2.0000 | ORAL_TABLET | Freq: Every day | ORAL | 3 refills | Status: AC

## 2023-11-10 ENCOUNTER — Ambulatory Visit: Admitting: Physician Assistant

## 2023-11-10 ENCOUNTER — Ambulatory Visit: Payer: Self-pay

## 2023-11-10 VITALS — BP 128/76 | HR 78 | Ht 73.0 in | Wt 238.0 lb

## 2023-11-10 DIAGNOSIS — R1032 Left lower quadrant pain: Secondary | ICD-10-CM | POA: Insufficient documentation

## 2023-11-10 LAB — POCT URINALYSIS DIP (CLINITEK)
Bilirubin, UA: NEGATIVE
Blood, UA: NEGATIVE
Glucose, UA: NEGATIVE mg/dL
Ketones, POC UA: NEGATIVE mg/dL
Leukocytes, UA: NEGATIVE
Nitrite, UA: NEGATIVE
POC PROTEIN,UA: NEGATIVE
Spec Grav, UA: <=1.005 — AB (ref 1.010–1.025)
Urobilinogen, UA: 0.2 U/dL
pH, UA: 5.5 (ref 5.0–8.0)

## 2023-11-10 NOTE — Telephone Encounter (Signed)
 FYI Only or Action Required?: Action required by provider: request for appointment.  Patient was last seen in primary care on 08/05/2023 by Alvia Bring, DO.  Called Nurse Triage reporting Abdominal Pain.  Symptoms began several days ago.  Interventions attempted: Nothing.  Symptoms are: gradually worsening. Constant now.   Triage Disposition: See HCP Within 4 Hours (Or PCP Triage)  Patient/caregiver understands and will follow disposition?: Yes    Copied from CRM [id number]. Topic: Clinical - Red Word Triage >> Nov 10, 2023  8:05 AM Larissa RAMAN wrote: [facility] that prompted transfer to Nurse Triage: lt lower abdomen pain Reason for Disposition  [1] MILD-MODERATE pain AND [2] constant AND [3] present > 2 hours  Answer Assessment - Initial Assessment Questions 1. LOCATION: Where does it hurt?      Left side at waist 2. RADIATION: Does the pain shoot anywhere else? (e.g., chest, back)     no 3. ONSET: When did the pain begin? (Minutes, hours or days ago)      2 days ago 4. SUDDEN: Gradual or sudden onset?     gradual 5. PATTERN Does the pain come and go, or is it constant?     constant 6. SEVERITY: How bad is the pain?  (e.g., Scale 1-10; mild, moderate, or severe)     3 7. RECURRENT SYMPTOM: Have you ever had this type of stomach pain before? If Yes, ask: When was the last time? and What happened that time?      yes 8. CAUSE: What do you think is causing the stomach pain? (e.g., gallstones, recent abdominal surgery)     unsure 9. RELIEVING/AGGRAVATING FACTORS: What makes it better or worse? (e.g., antacids, bending or twisting motion, bowel movement)     no 10. OTHER SYMPTOMS: Do you have any other symptoms? (e.g., back pain, diarrhea, fever, urination pain, vomiting)       no  Protocols used: Abdominal Pain - Male-A-AH

## 2023-11-10 NOTE — Telephone Encounter (Signed)
 Patient seen today by Sandy Crumb , PA

## 2023-11-10 NOTE — Patient Instructions (Signed)
 Will test urine and labs.  If pain continues will get CT scan.   Diverticulitis  Diverticulitis happens when poop (stool) and bacteria get trapped in small pouches in the colon called diverticula. These pouches may form if you have a condition called diverticulosis. When the poop and bacteria get trapped, it can cause an infection and inflammation. Diverticulitis may cause severe stomach pain and diarrhea. It can also lead to tissue damage in your colon. This can cause bleeding or blockage. In some cases, the diverticula may burst (rupture). This can cause infected poop to go into other parts of your abdomen. What are the causes? This condition is caused by poop getting trapped in the diverticula. This allows bacteria to grow. It can lead to inflammation and infection. What increases the risk? You are more likely to get this condition if you have diverticulosis. You are also more at risk if: You are overweight or obese. You do not get enough exercise. You drink alcohol. You smoke. You eat a lot of red meat, such as beef, pork, or lamb. You do not get enough fiber. Foods high in fiber include fruits, vegetables, beans, nuts, and whole grains. You are over 57 years of age. What are the signs or symptoms? Symptoms of this condition may include: Pain and tenderness in the abdomen. This pain is often felt on the left side but may occur in other spots. Fever and chills. Nausea and vomiting. Cramping. Bloating. Changes in how often you poop. Blood in your poop. How is this diagnosed? This condition is diagnosed based on your medical history and a physical exam. You may also have tests done to make sure there is nothing else causing your condition. These tests may include: Blood tests. Tests done on your pee (urine). A CT scan of the abdomen. You may need to have a colonoscopy. This is an exam to look at your whole large intestine. During the exam, a tube is put into the opening of your butt  (anus) and then moved into your rectum, colon, and other parts of the large intestine. This exam is done to look at the diverticula. It can also see if there is something else that may be causing your symptoms. How is this treated? Most cases are mild and can be treated at home. You may be told to: Take over-the-counter pain medicine. Only eat and drink clear liquids. Take antibiotics. Rest. More severe cases may need to be treated at a hospital. Treatment may include: Not eating or drinking. Taking pain medicines. Getting antibiotics through an IV. Getting fluids and nutrition through an IV. Surgery. Follow these instructions at home: Medicines Take over-the-counter and prescription medicines only as told by your health care provider. These include fiber supplements, probiotics, and medicines to soften your poop (stool softeners). If you were prescribed antibiotics, take them as told by your provider. Do not stop using the antibiotic even if you start to feel better. Ask your provider if the medicine prescribed to you requires you to avoid driving or using machinery. Eating and drinking  Follow the diet told by your provider. You may need to only eat and drink liquids. After your symptoms get better, you may be able to return to a more normal diet. You may be told to eat at least 25 grams (25 g) of fiber each day. Fiber makes it easier to poop. Healthy sources of fiber include: Berries. One cup has 4-8 g of fiber. Beans or lentils. One-half cup has 5-8 g  of fiber. Green vegetables. One cup has 4 g of fiber. Avoid eating red meat. General instructions Do not use any products that contain nicotine or tobacco. These products include cigarettes, chewing tobacco, and vaping devices, such as e-cigarettes. If you need help quitting, ask your provider. Exercise for at least 30 minutes, 3 times a week. Exercise hard enough to raise your heart rate and break a sweat. Contact a health care  provider if: Your pain gets worse. Your pooping does not go back to normal. Your symptoms do not get better with treatment. Your symptoms get worse all of a sudden. You have a fever. You vomit more than one time. Your poop is bloody, black, or tarry. This information is not intended to replace advice given to you by your health care provider. Make sure you discuss any questions you have with your health care provider. Document Revised: 10/23/2021 Document Reviewed: 10/23/2021 Elsevier Patient Education  2024 [employer].

## 2023-11-10 NOTE — Progress Notes (Signed)
 "  Acute Office Visit  Subjective:     Patient ID: Nicholas Le, male    DOB: [birth year], 53 y.o.   MRN: [id number]  Chief Complaint  Patient presents with   Abdominal Pain    HPI Discussed the use of AI scribe software for clinical note transcription with the patient, who gave verbal consent to proceed.  History of Present Illness Nicholas Le is a 53 year old male who presents with left lower quadrant abdominal pain.  Abdominal pain - Left lower quadrant abdominal pain for 3-4 days - Pain is constant, rated 3/10 - Initially presented as tightness, progressed to pain - No improvement with bowel movements - No changes in diet or recent consumption of seeds - No prior history of diverticulitis or colon infections  Bowel habits - Regular bowel movements, last occurred this morning - No constipation - Bowel habits can vary with low-carb, keto diet - Taking Metamucil for bloating, without relief  Constitutional symptoms - No fever or chills  Genitourinary symptoms - No urinary changes - No dysuria - No hematuria  Neurological symptoms - Mild headache - No other significant neurological symptoms  Recent medication changes - Started testosterone and B12 injections within the last two weeks     ROS  See HPI.     Objective:    BP 128/76   Pulse 78   Ht 6' 1 (1.854 m)   Wt 238 lb (108 kg)   SpO2 99%   BMI 31.40 kg/m  BP Readings from Last 3 Encounters:  11/10/23 128/76  08/05/23 113/77  03/25/22 123/81   Wt Readings from Last 3 Encounters:  11/10/23 238 lb (108 kg)  08/05/23 237 lb (107.5 kg)  03/25/22 249 lb 4.8 oz (113.1 kg)    .SABRA Results for orders placed or performed in visit on 11/10/23  CBC w/Diff/Platelet   Collection Time: 11/10/23  1:42 PM  Result Value Ref Range   WBC 7.7 3.4 - 10.8 x10E3/uL   RBC 5.92 (H) 4.14 - 5.80 x10E6/uL   Hemoglobin 15.3 13.0 - 17.7 g/dL   Hematocrit 52.6 62.4 - 51.0 %   MCV 80 79 - 97 fL   MCH 25.8 (L)  26.6 - 33.0 pg   MCHC 32.3 31.5 - 35.7 g/dL   RDW 85.8 88.3 - 84.5 %   Platelets 296 150 - 450 x10E3/uL   Neutrophils 71 Not Estab. %   Lymphs 14 Not Estab. %   Monocytes 12 Not Estab. %   Eos 1 Not Estab. %   Basos 1 Not Estab. %   Neutrophils Absolute 5.6 1.4 - 7.0 x10E3/uL   Lymphocytes Absolute 1.1 0.7 - 3.1 x10E3/uL   Monocytes Absolute 0.9 0.1 - 0.9 x10E3/uL   EOS (ABSOLUTE) 0.1 0.0 - 0.4 x10E3/uL   Basophils Absolute 0.1 0.0 - 0.2 x10E3/uL   Immature Granulocytes 0 Not Estab. %   Immature Grans (Abs) 0.0 0.0 - 0.1 x10E3/uL  CMP14+EGFR   Collection Time: 11/10/23  1:42 PM  Result Value Ref Range   Glucose 81 70 - 99 mg/dL   BUN 19 6 - 24 mg/dL   Creatinine, Ser 8.99 0.76 - 1.27 mg/dL   eGFR 89 >40 fO/fpw/8.26   BUN/Creatinine Ratio 19 9 - 20   Sodium 135 134 - 144 mmol/L   Potassium 3.6 3.5 - 5.2 mmol/L   Chloride 93 (L) 96 - 106 mmol/L   CO2 26 20 - 29 mmol/L   Calcium 10.1 8.7 - 10.2 mg/dL  Total Protein 7.5 6.0 - 8.5 g/dL   Albumin 4.6 3.8 - 4.9 g/dL   Globulin, Total 2.9 1.5 - 4.5 g/dL   Bilirubin Total 0.6 0.0 - 1.2 mg/dL   Alkaline Phosphatase 70 47 - 123 IU/L   AST 18 0 - 40 IU/L   ALT 23 0 - 44 IU/L  Lipase   Collection Time: 11/10/23  1:42 PM  Result Value Ref Range   Lipase 24 13 - 78 U/L  POCT URINALYSIS DIP (CLINITEK)   Collection Time: 11/10/23  3:15 PM  Result Value Ref Range   Color, UA yellow yellow   Clarity, UA clear clear   Glucose, UA negative negative mg/dL   Bilirubin, UA negative negative   Ketones, POC UA negative negative mg/dL   Spec Grav, UA <=8.994 (A) 1.010 - 1.025   Blood, UA negative negative   pH, UA 5.5 5.0 - 8.0   POC PROTEIN,UA negative negative, trace   Urobilinogen, UA 0.2 0.2 or 1.0 E.U./dL   Nitrite, UA Negative Negative   Leukocytes, UA Negative Negative     Physical Exam Constitutional:      Appearance: He is well-developed.  HENT:     Head: Normocephalic.  Abdominal:     General: Bowel sounds are normal.  There is no distension.     Palpations: Abdomen is soft. There is no hepatomegaly or mass.     Tenderness: There is abdominal tenderness in the left lower quadrant. There is no right CVA tenderness, left CVA tenderness, guarding or rebound. Negative signs include Murphy's sign, Rovsing's sign, McBurney's sign, psoas sign and obturator sign.     Hernia: No hernia is present.     Comments: Very mild tenderness to palpation over left lower quadrant.   Neurological:     General: No focal deficit present.     Mental Status: He is alert.  Psychiatric:        Mood and Affect: Mood normal.          Assessment & Plan:  .Nicholas Le was seen today for abdominal pain.  Diagnoses and all orders for this visit:  Left lower quadrant abdominal pain -     CBC w/Diff/Platelet -     CMP14+EGFR -     Lipase -     POCT URINALYSIS DIP (CLINITEK)   Assessment & Plan Left lower quadrant abdominal pain Persistent pain, differential includes diverticulitis and constipation. No acute abdomen signs. - Perform UA in office. Negative for Blood, leuks, nitrites, protein.  - Order CMP, lipase, and CBC. - Consider CT scan if pain persists or worsens and labs do not show any clear findings. - make sure stools are staying regular and soft. Ok to continue miralax.  - Provided handout on symptoms to monitor and instructions to call if pain increases significantly.    Nicholas Feld, PA-C   "

## 2023-11-11 LAB — CBC WITH DIFFERENTIAL/PLATELET
Basophils Absolute: 0.1 x10E3/uL (ref 0.0–0.2)
Basos: 1 %
EOS (ABSOLUTE): 0.1 x10E3/uL (ref 0.0–0.4)
Eos: 1 %
Hematocrit: 47.3 % (ref 37.5–51.0)
Hemoglobin: 15.3 g/dL (ref 13.0–17.7)
Immature Grans (Abs): 0 x10E3/uL (ref 0.0–0.1)
Immature Granulocytes: 0 %
Lymphocytes Absolute: 1.1 x10E3/uL (ref 0.7–3.1)
Lymphs: 14 %
MCH: 25.8 pg — ABNORMAL LOW (ref 26.6–33.0)
MCHC: 32.3 g/dL (ref 31.5–35.7)
MCV: 80 fL (ref 79–97)
Monocytes Absolute: 0.9 x10E3/uL (ref 0.1–0.9)
Monocytes: 12 %
Neutrophils Absolute: 5.6 x10E3/uL (ref 1.4–7.0)
Neutrophils: 71 %
Platelets: 296 x10E3/uL (ref 150–450)
RBC: 5.92 x10E6/uL — ABNORMAL HIGH (ref 4.14–5.80)
RDW: 14.1 % (ref 11.6–15.4)
WBC: 7.7 x10E3/uL (ref 3.4–10.8)

## 2023-11-11 LAB — CMP14+EGFR
ALT: 23 IU/L (ref 0–44)
AST: 18 IU/L (ref 0–40)
Albumin: 4.6 g/dL (ref 3.8–4.9)
Alkaline Phosphatase: 70 IU/L (ref 47–123)
BUN/Creatinine Ratio: 19 (ref 9–20)
BUN: 19 mg/dL (ref 6–24)
Bilirubin Total: 0.6 mg/dL (ref 0.0–1.2)
CO2: 26 mmol/L (ref 20–29)
Calcium: 10.1 mg/dL (ref 8.7–10.2)
Chloride: 93 mmol/L — ABNORMAL LOW (ref 96–106)
Creatinine, Ser: 1 mg/dL (ref 0.76–1.27)
Globulin, Total: 2.9 g/dL (ref 1.5–4.5)
Glucose: 81 mg/dL (ref 70–99)
Potassium: 3.6 mmol/L (ref 3.5–5.2)
Sodium: 135 mmol/L (ref 134–144)
Total Protein: 7.5 g/dL (ref 6.0–8.5)
eGFR: 89 mL/min/1.73 (ref >59)

## 2023-11-11 LAB — LIPASE: Lipase: 24 U/L (ref 13–78)

## 2023-11-12 ENCOUNTER — Encounter: Payer: Self-pay | Admitting: Physician Assistant

## 2023-11-12 ENCOUNTER — Ambulatory Visit: Payer: Self-pay | Admitting: Physician Assistant

## 2023-11-12 DIAGNOSIS — M1611 Unilateral primary osteoarthritis, right hip: Secondary | ICD-10-CM

## 2023-11-12 DIAGNOSIS — I700 Atherosclerosis of aorta: Secondary | ICD-10-CM

## 2023-11-12 DIAGNOSIS — K6389 Other specified diseases of intestine: Secondary | ICD-10-CM

## 2023-11-12 NOTE — Progress Notes (Signed)
 Lou,   No signs of infection with normal white blood count.  Normal kidney, and liver function.  Electrolytes look good.  Normal pancreatic enzymes.   How is abdominal pain today?

## 2023-11-15 ENCOUNTER — Ambulatory Visit (HOSPITAL_BASED_OUTPATIENT_CLINIC_OR_DEPARTMENT_OTHER)

## 2023-11-16 ENCOUNTER — Ambulatory Visit (HOSPITAL_BASED_OUTPATIENT_CLINIC_OR_DEPARTMENT_OTHER)
Admission: RE | Admit: 2023-11-16 | Discharge: 2023-11-16 | Disposition: A | Discharge status: Home / Self Care | Source: Ambulatory Visit | Attending: Physician Assistant | Admitting: Physician Assistant

## 2023-11-16 ENCOUNTER — Encounter (HOSPITAL_BASED_OUTPATIENT_CLINIC_OR_DEPARTMENT_OTHER): Payer: Self-pay

## 2023-11-16 DIAGNOSIS — I700 Atherosclerosis of aorta: Secondary | ICD-10-CM | POA: Insufficient documentation

## 2023-11-16 DIAGNOSIS — R1032 Left lower quadrant pain: Secondary | ICD-10-CM | POA: Insufficient documentation

## 2023-11-16 DIAGNOSIS — M1611 Unilateral primary osteoarthritis, right hip: Secondary | ICD-10-CM | POA: Insufficient documentation

## 2023-11-16 DIAGNOSIS — K6389 Other specified diseases of intestine: Secondary | ICD-10-CM | POA: Insufficient documentation

## 2023-11-16 MED ORDER — IOHEXOL 300 MG/ML  SOLN
100.0000 mL | Freq: Once | INTRAMUSCULAR | Status: AC | PRN
  Administered 2023-11-16: 100 mL via INTRAVENOUS

## 2023-11-16 NOTE — Progress Notes (Signed)
 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 This is a benign condition and usually resolves on its own in 7-14 days but can take up to 3 weeks. Ice or heat over area of pain. Ibuprofen or tylenol as needed for pain.   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  Right hip osteoarthritis Certainly talk to PCP about this. I would consider statin to reduce cardiovascular risk.
